# Patient Record
Sex: Male | Born: 1990 | Race: Black or African American | Hispanic: No | Marital: Single | Smoking: Current some day smoker
Health system: Southern US, Community
[De-identification: ages and names within clinical notes are randomized; demographics above are authoritative.]

---
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
## 2011-05-13 LAB — URINALYSIS, ROUTINE W REFLEX MICROSCOPIC
Glucose, UA: NEGATIVE
Hgb urine dipstick: NEGATIVE
Urobilinogen, UA: 0.2
pH: 5.5

## 2011-05-13 LAB — URINE MICROSCOPIC-ADD ON

## 2019-12-14 ENCOUNTER — Ambulatory Visit: Payer: Self-pay | Attending: Internal Medicine

## 2019-12-14 DIAGNOSIS — Z23 Encounter for immunization: Secondary | ICD-10-CM

## 2019-12-14 NOTE — Progress Notes (Signed)
   Covid-19 Vaccination Clinic  Name:  Ian Key    MRN: [id number] DOB: 07/20/1991  12/14/2019  [name] was observed post Covid-19 immunization for 15 minutes without incident. He was provided with Vaccine Information Sheet and instruction to access the V-Safe system.   [name] was instructed to call 911 with any severe reactions post vaccine: Marland Kitchen Difficulty breathing  . Swelling of face and throat  . A fast heartbeat  . A bad rash all over body  . Dizziness and weakness   Immunizations Administered    Name Date Dose VIS Date Route   Pfizer COVID-19 Vaccine 12/14/2019 11:37 AM 0.3 mL 10/06/2018 Intramuscular   Manufacturer: [employer], [employer]   Lot: [id number]   NDC: [address]-2

## 2020-01-11 ENCOUNTER — Ambulatory Visit: Payer: Self-pay

## 2021-06-15 ENCOUNTER — Other Ambulatory Visit: Payer: Self-pay

## 2021-06-15 ENCOUNTER — Emergency Department (HOSPITAL_COMMUNITY)
Admission: EM | Admit: 2021-06-15 | Discharge: 2021-06-19 | Disposition: A | Discharge status: Home / Self Care | Attending: Emergency Medicine | Admitting: Emergency Medicine

## 2021-06-15 DIAGNOSIS — Z20822 Contact with and (suspected) exposure to covid-19: Secondary | ICD-10-CM | POA: Insufficient documentation

## 2021-06-15 DIAGNOSIS — F250 Schizoaffective disorder, bipolar type: Secondary | ICD-10-CM | POA: Diagnosis not present

## 2021-06-15 DIAGNOSIS — R440 Auditory hallucinations: Secondary | ICD-10-CM | POA: Diagnosis present

## 2021-06-15 DIAGNOSIS — R443 Hallucinations, unspecified: Secondary | ICD-10-CM

## 2021-06-15 DIAGNOSIS — F1290 Cannabis use, unspecified, uncomplicated: Secondary | ICD-10-CM | POA: Diagnosis present

## 2021-06-15 LAB — COMPREHENSIVE METABOLIC PANEL
ALT: 27 U/L (ref 0–44)
AST: 18 U/L (ref 15–41)
Albumin: 4.3 g/dL (ref 3.5–5.0)
Alkaline Phosphatase: 44 U/L (ref 38–126)
Anion gap: 6 (ref 5–15)
BUN: 12 mg/dL (ref 6–20)
CO2: 23 mmol/L (ref 22–32)
Calcium: 8.9 mg/dL (ref 8.9–10.3)
Chloride: 108 mmol/L (ref 98–111)
Creatinine, Ser: 1.02 mg/dL (ref 0.61–1.24)
GFR, Estimated: >60 mL/min (ref >60)
Glucose, Bld: 103 mg/dL — ABNORMAL HIGH (ref 70–99)
Potassium: 3.7 mmol/L (ref 3.5–5.1)
Sodium: 137 mmol/L (ref 135–145)
Total Bilirubin: 0.7 mg/dL (ref 0.3–1.2)
Total Protein: 6.9 g/dL (ref 6.5–8.1)

## 2021-06-15 LAB — ETHANOL: Alcohol, Ethyl (B): <10 mg/dL (ref <10)

## 2021-06-15 LAB — RAPID URINE DRUG SCREEN, HOSP PERFORMED
Amphetamines: NOT DETECTED
Barbiturates: NOT DETECTED
Benzodiazepines: NOT DETECTED
Cocaine: NOT DETECTED
Opiates: NOT DETECTED
Tetrahydrocannabinol: POSITIVE — AB

## 2021-06-15 LAB — CBC
HCT: 41.1 % (ref 39.0–52.0)
Hemoglobin: 14.3 g/dL (ref 13.0–17.0)
MCH: 28.9 pg (ref 26.0–34.0)
MCHC: 34.8 g/dL (ref 30.0–36.0)
MCV: 83 fL (ref 80.0–100.0)
Platelets: 215 10*3/uL (ref 150–400)
RBC: 4.95 MIL/uL (ref 4.22–5.81)
RDW: 12.8 % (ref 11.5–15.5)
WBC: 4.5 10*3/uL (ref 4.0–10.5)
nRBC: 0 % (ref 0.0–0.2)

## 2021-06-15 LAB — SALICYLATE LEVEL: Salicylate Lvl: <7 mg/dL — ABNORMAL LOW (ref 7.0–30.0)

## 2021-06-15 LAB — ACETAMINOPHEN LEVEL: Acetaminophen (Tylenol), Serum: <10 ug/mL — ABNORMAL LOW (ref 10–30)

## 2021-06-15 MED ORDER — RISPERIDONE 2 MG PO TABS
2.0000 mg | ORAL_TABLET | Freq: Two times a day (BID) | ORAL | Status: DC
  Administered 2021-06-15 – 2021-06-19 (×6): 2 mg via ORAL
  Filled 2021-06-15 (×6): qty 1
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
## 2021-06-15 NOTE — ED Notes (Signed)
Please call pt's sister, Jimmey Hengel before decisions for treatment [phone]

## 2021-06-15 NOTE — ED Notes (Signed)
Pt provided w/ purple scrubs, pt belonging bags and non-slip socks. Process explained to mother and pt.

## 2021-06-15 NOTE — ED Notes (Signed)
Pt dressed into purple scrubs and wanded by security. Belongings taken and placed behind triage nurse's station.
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
## 2021-06-16 DIAGNOSIS — F1290 Cannabis use, unspecified, uncomplicated: Secondary | ICD-10-CM | POA: Diagnosis present

## 2021-06-16 DIAGNOSIS — F250 Schizoaffective disorder, bipolar type: Secondary | ICD-10-CM
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
## 2021-06-16 NOTE — Progress Notes (Signed)
Patient meets criteria for inpatient treatment per Robb Matar NP. No available beds at [facility] currently. CSW faxed referrals to the following facilities for review (all VA facilities and residential facilities within 75 miles due to [employer] refusal/inability to transport on the weekend):  [facility]  TTS will continue to seek bed placement.   Trula Slade, MSW, LCSW Clinical Social Worker 06/16/2021 1:13 PM

## 2021-06-16 NOTE — ED Notes (Signed)
Pt alert x 4 denies SI/ HI states his mom always call the police on him when she gets mad at times. 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 I will continue to monitor.

## 2021-06-16 NOTE — ED Provider Notes (Signed)
Emergency Medicine Observation Re-evaluation Note  Ian Key is a 30 y.o. male, seen on rounds today.  Pt initially presented to the ED for complaints of IVC Currently, the patient is waiting for an inpatient psychiatric bed.  Physical Exam  BP 109/71 (BP Location: Right Arm)   Pulse 74   Temp (!) 97.5 F (36.4 C) (Oral)   Resp 17   SpO2 97%  Physical Exam General: Calm, resting Cardiac: Regular rate Lungs: Breathing easily Psych: Deferred, sleeping  ED Course / MDM  EKG:   I have reviewed the labs performed to date as well as medications administered while in observation.  Recent changes in the last 24 hours include initial ED evaluation and psychiatric assessment.  Plan  Current plan is for inpatient psychiatric treatment.  Luismanuel L Chavira is not under involuntary commitment.     Linwood Dibbles, MD 06/16/21 [phone]

## 2021-06-17 NOTE — ED Provider Notes (Signed)
Emergency Medicine Observation Re-evaluation Note  Ian Key is a 30 y.o. male, seen on rounds today.  Pt initially presented to the ED for complaints of IVC Currently, the patient is resting comfortably.  Physical Exam  BP 103/79 (BP Location: Right Arm)   Pulse 89   Temp 98 F (36.7 C) (Oral)   Resp 18   SpO2 98%  Physical Exam   ED Course / MDM  EKG:   I have reviewed the labs performed to date as well as medications administered while in observation.  Recent changes in the last 24 hours include nothing.  Plan  Current plan is for awaiting placement.  Belinda L Benally is not under involuntary commitment.     Lorre Nick, MD 06/17/21 1235

## 2021-06-17 NOTE — Progress Notes (Signed)
Pt still meets criteria for inpatient behavioral health placement per Leevy-Johnson, NP. CSW will share in shift report to have 1st shift CSW to follow-up.   Maryjean Ka, MSW, [facility] 06/17/2021 6:34 PM

## 2021-06-18 MED ORDER — ACETAMINOPHEN 325 MG PO TABS
650.0000 mg | ORAL_TABLET | Freq: Four times a day (QID) | ORAL | Status: DC | PRN
  Administered 2021-06-18: 650 mg via ORAL
  Filled 2021-06-18: qty 2

## 2021-06-18 NOTE — Progress Notes (Signed)
06/18/2021  1857  Ian Key [phone] Left message that patient needs to be transported to [facility] 06/19/21 after 8am.

## 2021-06-18 NOTE — Progress Notes (Signed)
Pt was accepted to [facility] tomorrow 06/19/21 after 0800am. -Pt must be IVC'd to be fully accepted.  Pt meets inpatient criteria per Robb Matar NP  Attending Physician will be Dr. Estill Cotta  Report can be called to: [phone] and supervisor phone [phone]  Pt can arrive after 0800am  Care Team notified via secure chat: Kristine Royal, MD, and Vivi Ferns, RN.    Dr. Rodena Medin confirmed that IVC paperwork was completed. CSW requested that transported to law enforcement be called to coordinate transportation. Vivi Ferns, RN confirmed that all informed was received for nursing and shared with CSW that she called and left a message on the sheriff's line for transport tomorrow. CSW requested that IVC paperwork be faxed to [facility] by nursing to [phone].  Kelton Pillar, LCSWA 06/18/2021 @ 6:56 PM

## 2021-06-18 NOTE — Progress Notes (Addendum)
ADDENDUM  CSW has confirmed with April at [facility] that all [facility] facilities are on mental health diversion at this time. CSW will pursue placement at civilian facilities. Patient referred out again at this time.   Receives care at [facility], Social Worker Gayla Medicus [place], Pager [phone] office number [phone] ext [phone]  Call [facility] to [phone] ext [phone] for discharge appointments.   Signed:  Corky Crafts, MSW, [place], LCASA 06/18/2021 1:26 PM  CSW attempted to reach VA to determine psychiatric inpatient availability x2, left HIPAA compliant VM w/ contact information and callback request.   CSW to coordinate with Vikki Ports, [phone], VA liaison with [facility], [facility], and [facility] should the [place] be on diversion.   Situation ongoing, CSW will continue to monitor and update note as more information becomes available.   Signed:  Corky Crafts, MSW, [place], LCASA 06/18/2021 12:53 PM

## 2021-06-18 NOTE — ED Provider Notes (Signed)
Emergency Medicine Observation Re-evaluation Note  Ian Key is a 30 y.o. male, seen on rounds today.  Pt initially presented to the ED for complaints of IVC Currently, the patient is resting.  Physical Exam  BP 111/68 (BP Location: Right Arm)   Pulse 76   Temp 97.6 F (36.4 C) (Oral)   Resp 16   SpO2 99%  Physical Exam General: NAD Cardiac: well perfused Lungs: even and unlabored Psych: No agitation  ED Course / MDM  EKG:   I have reviewed the labs performed to date as well as medications administered while in observation.  Recent changes in the last 24 hours include none.  Plan  Current plan is for inpatient psychiatric admission. Social work Warden/ranger. [name] is not under involuntary commitment.     Ernie Avena, MD 06/18/21 [phone]

## 2021-06-18 NOTE — ED Notes (Signed)
IVC paperwork faxed to [phone] [facility] Intake)

## 2021-06-19 LAB — POC SARS CORONAVIRUS 2 AG -  ED: SARSCOV2ONAVIRUS 2 AG: NEGATIVE

## 2021-06-19 NOTE — ED Notes (Signed)
Pt off unit to [facility] per provider. Pt alert, calm, cooperative, no s/s of distress. DC information and belongings given to sheriff for transport.  Pt ambulatory off unit, escorted and transported by sheriff.

## 2021-06-27 NOTE — Progress Notes (Signed)
Received a call from [facility] regarding patient having (2) IVC dated for 06/15/2021 and 06/18/2021. 06/15/2021  had incorrect information to include date of birth as 11/30/1989, which was initiated by his mother. 06/18/2021 was initiated by EDP. She believes the transporting officer completed the incorrect custody and findings form. Writer provided assistance to the best of my clinical ability to assist without physical documents being present. She states she has no other additional concerns.

## 2021-11-13 ENCOUNTER — Emergency Department (HOSPITAL_COMMUNITY)
Admission: EM | Admit: 2021-11-13 | Discharge: 2021-11-13 | Disposition: A | Discharge status: Other Acute Inpatient Hospital | Attending: Emergency Medicine | Admitting: Emergency Medicine

## 2021-11-13 ENCOUNTER — Other Ambulatory Visit: Payer: Self-pay

## 2021-11-13 ENCOUNTER — Encounter (HOSPITAL_COMMUNITY): Payer: Self-pay

## 2021-11-13 DIAGNOSIS — F312 Bipolar disorder, current episode manic severe with psychotic features: Secondary | ICD-10-CM | POA: Insufficient documentation

## 2021-11-13 DIAGNOSIS — Z20822 Contact with and (suspected) exposure to covid-19: Secondary | ICD-10-CM | POA: Insufficient documentation

## 2021-11-13 DIAGNOSIS — F259 Schizoaffective disorder, unspecified: Secondary | ICD-10-CM | POA: Insufficient documentation

## 2021-11-13 DIAGNOSIS — Z046 Encounter for general psychiatric examination, requested by authority: Secondary | ICD-10-CM | POA: Diagnosis not present

## 2021-11-13 DIAGNOSIS — F1210 Cannabis abuse, uncomplicated: Secondary | ICD-10-CM | POA: Insufficient documentation

## 2021-11-13 LAB — COMPREHENSIVE METABOLIC PANEL
ALT: 22 U/L (ref 0–44)
AST: 16 U/L (ref 15–41)
Albumin: 4.4 g/dL (ref 3.5–5.0)
Alkaline Phosphatase: 37 U/L — ABNORMAL LOW (ref 38–126)
Anion gap: 7 (ref 5–15)
BUN: 9 mg/dL (ref 6–20)
CO2: 25 mmol/L (ref 22–32)
Calcium: 9.3 mg/dL (ref 8.9–10.3)
Chloride: 108 mmol/L (ref 98–111)
Creatinine, Ser: 0.98 mg/dL (ref 0.61–1.24)
GFR, Estimated: >60 mL/min (ref >60)
Glucose, Bld: 105 mg/dL — ABNORMAL HIGH (ref 70–99)
Potassium: 3.9 mmol/L (ref 3.5–5.1)
Sodium: 140 mmol/L (ref 135–145)
Total Bilirubin: 0.9 mg/dL (ref 0.3–1.2)
Total Protein: 7.1 g/dL (ref 6.5–8.1)

## 2021-11-13 LAB — CBC WITH DIFFERENTIAL/PLATELET
Abs Immature Granulocytes: 0.01 10*3/uL (ref 0.00–0.07)
Basophils Absolute: 0 10*3/uL (ref 0.0–0.1)
Basophils Relative: 1 %
Eosinophils Absolute: 0.1 10*3/uL (ref 0.0–0.5)
Eosinophils Relative: 1 %
HCT: 44 % (ref 39.0–52.0)
Hemoglobin: 14.9 g/dL (ref 13.0–17.0)
Immature Granulocytes: 0 %
Lymphocytes Relative: 32 %
Lymphs Abs: 1.9 10*3/uL (ref 0.7–4.0)
MCH: 29.1 pg (ref 26.0–34.0)
MCHC: 33.9 g/dL (ref 30.0–36.0)
MCV: 85.9 fL (ref 80.0–100.0)
Monocytes Absolute: 0.3 10*3/uL (ref 0.1–1.0)
Monocytes Relative: 5 %
Neutro Abs: 3.6 10*3/uL (ref 1.7–7.7)
Neutrophils Relative %: 61 %
Platelets: 239 10*3/uL (ref 150–400)
RBC: 5.12 MIL/uL (ref 4.22–5.81)
RDW: 12.4 % (ref 11.5–15.5)
WBC: 5.9 10*3/uL (ref 4.0–10.5)
nRBC: 0 % (ref 0.0–0.2)

## 2021-11-13 LAB — RAPID URINE DRUG SCREEN, HOSP PERFORMED
Amphetamines: NOT DETECTED
Barbiturates: NOT DETECTED
Benzodiazepines: NOT DETECTED
Cocaine: NOT DETECTED
Opiates: NOT DETECTED
Tetrahydrocannabinol: POSITIVE — AB

## 2021-11-13 LAB — RESP PANEL BY RT-PCR (FLU A&B, COVID) ARPGX2
Influenza A by PCR: NEGATIVE
Influenza B by PCR: NEGATIVE
SARS Coronavirus 2 by RT PCR: NEGATIVE

## 2021-11-13 LAB — SALICYLATE LEVEL: Salicylate Lvl: <7 mg/dL — ABNORMAL LOW (ref 7.0–30.0)

## 2021-11-13 LAB — ACETAMINOPHEN LEVEL: Acetaminophen (Tylenol), Serum: <10 ug/mL — ABNORMAL LOW (ref 10–30)

## 2021-11-13 LAB — ETHANOL: Alcohol, Ethyl (B): <10 mg/dL (ref <10)

## 2021-11-13 LAB — VALPROIC ACID LEVEL: Valproic Acid Lvl: <10 ug/mL — ABNORMAL LOW (ref 50.0–100.0)

## 2021-11-13 MED ORDER — RISPERIDONE 3 MG PO TABS
3.0000 mg | ORAL_TABLET | Freq: Two times a day (BID) | ORAL | Status: DC
  Administered 2021-11-13 (×2): 3 mg via ORAL
  Filled 2021-11-13 (×2): qty 1

## 2021-11-13 MED ORDER — DIVALPROEX SODIUM 500 MG PO DR TAB
500.0000 mg | DELAYED_RELEASE_TABLET | Freq: Two times a day (BID) | ORAL | Status: DC
  Administered 2021-11-13 (×2): 500 mg via ORAL
  Filled 2021-11-13 (×2): qty 1

## 2021-11-13 NOTE — ED Triage Notes (Addendum)
Pt BIB [facility] due to IVC by mother. She stated he was c/o being dead several times. He attempted to buy a gun today & was dx with bipolar. ?

## 2021-11-13 NOTE — ED Provider Notes (Signed)
?[facility] EMERGENCY DEPARTMENT ?Provider Note ? ? ?CSN: [id number] ?Arrival date & time: 11/13/21  0004 ? ?  ? ?History ? ?Chief Complaint  ?Patient presents with  ? IVC  ? ? ?Ian Key is a 31 y.o. male. ? ?The history is provided by the patient and medical records.  ? ?31 year old male with history of schizoaffective disorder, presenting to the ED under IVC petition by his mother.  Reportedly he has made several statements today about "wanting to be dead" and apparently tried to buy handgun today as well.  He has been abusing marijuana. ? ?When asked why patient is here today he states "I do not know".  Denies SI/HI/AVH.  Denies drug or EtOH use today. ? ?Home Medications ?Prior to Admission medications   ?Medication Sig Start Date End Date Taking? Authorizing Provider  ?risperidone (RISPERDAL) 4 MG tablet Take 2 mg by mouth in the morning and at bedtime. 12/13/20   [provider]  ?   ? ?Allergies    ?Patient has no known allergies.   ? ?Review of Systems   ?Review of Systems  ?Psychiatric/Behavioral:    ?     IVC  ?All other systems reviewed and are negative. ? ?Physical Exam ?Updated Vital Signs ?BP (!) 141/90 (BP Location: Right Arm)   Pulse 79   Temp 97.7 ?F (36.5 ?C) (Oral)   SpO2 99%  ? ?Physical Exam ?Vitals and nursing note reviewed.  ?Constitutional:   ?   Appearance: He is well-developed.  ?HENT:  ?   Head: Normocephalic and atraumatic.  ?Eyes:  ?   Conjunctiva/sclera: Conjunctivae normal.  ?   Pupils: Pupils are equal, round, and reactive to light.  ?Cardiovascular:  ?   Rate and Rhythm: Normal rate and regular rhythm.  ?   Heart sounds: Normal heart sounds.  ?Pulmonary:  ?   Effort: Pulmonary effort is normal.  ?   Breath sounds: Normal breath sounds.  ?Abdominal:  ?   General: Bowel sounds are normal.  ?   Palpations: Abdomen is soft.  ?Musculoskeletal:     ?   General: Normal range of motion.  ?   Cervical back: Normal range of motion.  ?Skin: ?   General: Skin is  warm and dry.  ?Neurological:  ?   Mental Status: He is alert and oriented to person, place, and time.  ?Psychiatric:  ?   Comments: Avoidant when questioned. ?Denies SI/HI/AVH  ? ? ?ED Results / Procedures / Treatments   ?Labs ?(all labs ordered are listed, but only abnormal results are displayed) ?Labs Reviewed  ?COMPREHENSIVE METABOLIC PANEL - Abnormal; Notable for the following components:  ?    Result Value  ? Glucose, Bld 105 (*)   ? Alkaline Phosphatase 37 (*)   ? All other components within normal limits  ?RAPID URINE DRUG SCREEN, HOSP PERFORMED - Abnormal; Notable for the following components:  ? Tetrahydrocannabinol POSITIVE (*)   ? All other components within normal limits  ?SALICYLATE LEVEL - Abnormal; Notable for the following components:  ? Salicylate Lvl <7.0 (*)   ? All other components within normal limits  ?ACETAMINOPHEN LEVEL - Abnormal; Notable for the following components:  ? Acetaminophen (Tylenol), Serum <10 (*)   ? All other components within normal limits  ?RESP PANEL BY RT-PCR (FLU A&B, COVID) ARPGX2  ?CBC WITH DIFFERENTIAL/PLATELET  ?ETHANOL  ? ? ?EKG ?None ? ?Radiology ?No results found. ? ?Procedures ?Procedures  ? ? ?Medications Ordered in ED ?  Medications - No data to display ? ?ED Course/ Medical Decision Making/ A&P ?  ?                        ?Medical Decision Making ?Amount and/or Complexity of Data Reviewed ?Labs: ordered. ? ? ?31 year old male presenting to the ED under IVC petition by his mother.  Reportedly has been talking about "wanting to die" and attempted to buy handgun today.  Has history of schizoaffective disorder and is not currently taking his meds.  He is avoidant with questioning during triage.  He denies  SI/HI/AVH.   ? ?Labs were obtained and are overall reassuring.  UDS is positive for THC.  Medically clear.  Will get TTS evaluation. ? ?TTS has evaluated, recommended inpatient treatment. They will seek placement at the [facility] as he has military benefits.   ? ?Final  Clinical Impression(s) / ED Diagnoses ?Final diagnoses:  ?Schizoaffective disorder, unspecified type (HCC)  ? ? ?Rx / DC Orders ?ED Discharge Orders   ? ? None  ? ?  ? ? ?  ?Garlon Hatchet, PA-C ?11/13/21 0545 ? ?  ?Shon Baton, MD ?11/13/21 [phone] ? ?

## 2021-11-13 NOTE — Progress Notes (Signed)
[facility] LCSW Progress Note ?  ?11/13/2021    2:20 PM ? ?NGAI PARCELL  ? ?[id number]  ? ?Type of Contact and Topic:  Psychiatric Bed Placement  ? ?Pt accepted to [facility]   ? ?Patient meets inpatient criteria per Melbourne Abts, PA-C   ? ?The attending provider will be Phineas Inches, MD ? ?Call report to [phone]   ? ?Shuronia Pflueger, RN @ [facility] ED notified.    ? ?Pt scheduled  to arrive at [facility] TODAY at 2200. Please fax IVC paperwork prior to transporting this Pt.  ? ? ?Damita Dunnings, MSW, LCSW-A  ?2:25 PM 11/13/2021   ?  ? ?  ?  ? ? ? ? ?  ?
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
## 2021-11-13 NOTE — ED Notes (Signed)
Patient notified that he will be going to [facility] this evening after 2200. ?

## 2021-11-13 NOTE — ED Notes (Addendum)
Pt is physically in [place] in triage not in waiting room.Will call security to want patient ?
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
## 2021-11-14 ENCOUNTER — Other Ambulatory Visit: Payer: Self-pay

## 2021-11-14 ENCOUNTER — Inpatient Hospital Stay (HOSPITAL_COMMUNITY)
Admission: AD | Admit: 2021-11-14 | Discharge: 2021-11-21 | DRG: 885 | Disposition: A | Discharge status: Home / Self Care | Source: Intra-hospital | Attending: Psychiatry | Admitting: Psychiatry

## 2021-11-14 ENCOUNTER — Encounter (HOSPITAL_COMMUNITY): Payer: Self-pay

## 2021-11-14 ENCOUNTER — Encounter (HOSPITAL_COMMUNITY): Payer: Self-pay | Admitting: Student

## 2021-11-14 DIAGNOSIS — F17200 Nicotine dependence, unspecified, uncomplicated: Secondary | ICD-10-CM | POA: Diagnosis present

## 2021-11-14 DIAGNOSIS — Z599 Problem related to housing and economic circumstances, unspecified: Secondary | ICD-10-CM

## 2021-11-14 DIAGNOSIS — F1290 Cannabis use, unspecified, uncomplicated: Secondary | ICD-10-CM | POA: Diagnosis present

## 2021-11-14 DIAGNOSIS — H16149 Punctate keratitis, unspecified eye: Secondary | ICD-10-CM | POA: Insufficient documentation

## 2021-11-14 DIAGNOSIS — F17210 Nicotine dependence, cigarettes, uncomplicated: Secondary | ICD-10-CM | POA: Diagnosis present

## 2021-11-14 DIAGNOSIS — G4700 Insomnia, unspecified: Secondary | ICD-10-CM | POA: Diagnosis present

## 2021-11-14 DIAGNOSIS — F1020 Alcohol dependence, uncomplicated: Secondary | ICD-10-CM | POA: Insufficient documentation

## 2021-11-14 DIAGNOSIS — F1210 Cannabis abuse, uncomplicated: Secondary | ICD-10-CM | POA: Diagnosis not present

## 2021-11-14 DIAGNOSIS — Z91148 Patient's other noncompliance with medication regimen for other reason: Secondary | ICD-10-CM | POA: Diagnosis not present

## 2021-11-14 DIAGNOSIS — F419 Anxiety disorder, unspecified: Secondary | ICD-10-CM | POA: Diagnosis present

## 2021-11-14 DIAGNOSIS — M25529 Pain in unspecified elbow: Secondary | ICD-10-CM | POA: Insufficient documentation

## 2021-11-14 DIAGNOSIS — F250 Schizoaffective disorder, bipolar type: Principal | ICD-10-CM | POA: Diagnosis present

## 2021-11-14 DIAGNOSIS — E559 Vitamin D deficiency, unspecified: Secondary | ICD-10-CM | POA: Diagnosis present

## 2021-11-14 DIAGNOSIS — F1910 Other psychoactive substance abuse, uncomplicated: Secondary | ICD-10-CM | POA: Insufficient documentation

## 2021-11-14 LAB — LIPID PANEL
Cholesterol: 148 mg/dL (ref 0–200)
HDL: 47 mg/dL (ref >40)
LDL Cholesterol: 92 mg/dL (ref 0–99)
Total CHOL/HDL Ratio: 3.1 RATIO
Triglycerides: 47 mg/dL (ref <150)
VLDL: 9 mg/dL (ref 0–40)

## 2021-11-14 LAB — TSH: TSH: 1.046 u[IU]/mL (ref 0.350–4.500)

## 2021-11-14 MED ORDER — LORAZEPAM 1 MG PO TABS: 1.0000 mg | ORAL_TABLET | ORAL | Status: DC | PRN

## 2021-11-14 MED ORDER — ACETAMINOPHEN 325 MG PO TABS: 650.0000 mg | ORAL_TABLET | Freq: Four times a day (QID) | ORAL | Status: DC | PRN

## 2021-11-14 MED ORDER — RISPERIDONE 3 MG PO TABS
3.0000 mg | ORAL_TABLET | Freq: Two times a day (BID) | ORAL | Status: DC
  Administered 2021-11-14 – 2021-11-15 (×3): 3 mg via ORAL
  Filled 2021-11-14 (×5): qty 1

## 2021-11-14 MED ORDER — MAGNESIUM HYDROXIDE 400 MG/5ML PO SUSP: 30.0000 mL | Freq: Every day | ORAL | Status: DC | PRN

## 2021-11-14 MED ORDER — DIVALPROEX SODIUM 500 MG PO DR TAB
500.0000 mg | DELAYED_RELEASE_TABLET | Freq: Two times a day (BID) | ORAL | Status: DC
  Administered 2021-11-14 – 2021-11-15 (×3): 500 mg via ORAL
  Filled 2021-11-14 (×5): qty 1

## 2021-11-14 MED ORDER — TRAZODONE HCL 50 MG PO TABS
50.0000 mg | ORAL_TABLET | Freq: Every evening | ORAL | Status: DC | PRN
  Filled 2021-11-14: qty 1

## 2021-11-14 MED ORDER — ALUM & MAG HYDROXIDE-SIMETH 200-200-20 MG/5ML PO SUSP
30.0000 mL | ORAL | Status: DC | PRN
  Administered 2021-11-20: 30 mL via ORAL
  Filled 2021-11-14: qty 30

## 2021-11-14 MED ORDER — HYDROXYZINE HCL 25 MG PO TABS
25.0000 mg | ORAL_TABLET | Freq: Three times a day (TID) | ORAL | Status: DC | PRN
  Filled 2021-11-14: qty 1

## 2021-11-14 MED ORDER — THIAMINE HCL 100 MG PO TABS
100.0000 mg | ORAL_TABLET | Freq: Every day | ORAL | Status: DC
  Administered 2021-11-14 – 2021-11-15 (×2): 100 mg via ORAL
  Filled 2021-11-14 (×3): qty 1
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
## 2021-11-14 NOTE — Plan of Care (Signed)
?  Problem: Activity: ?Goal: Interest or engagement in activities will improve ?Outcome: Progressing ?  ?Problem: Coping: ?Goal: Ability to verbalize frustrations and anger appropriately will improve ?Outcome: Progressing ?  ?Problem: Coping: ?Goal: Ability to demonstrate self-control will improve ?Outcome: Progressing ?  ?Problem: Safety: ?Goal: Periods of time without injury will increase ?Outcome: Progressing ?  ?Problem: Nutritional: ?Goal: Ability to achieve adequate nutritional intake will improve ?Outcome: Progressing ?  ?
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
## 2021-11-14 NOTE — Progress Notes (Signed)
?   11/14/21 1322  ?Psych Admission Type (Psych Patients Only)  ?Admission Status Involuntary  ?Psychosocial Assessment  ?Patient Complaints Worrying  ?Eye Contact Fair  ?Facial Expression Flat  ?Affect Flat  ?Speech Logical/coherent  ?Interaction Minimal  ?Motor Activity Other (Comment) ?(WNL)  ?Appearance/Hygiene In scrubs  ?Behavior Characteristics Cooperative;Calm  ?Mood Pleasant  ?Thought Process  ?Coherency WDL  ?Content WDL  ?Delusions None reported or observed  ?Perception WDL  ?Hallucination None reported or observed  ?Judgment Poor  ?Confusion None  ?Danger to Self  ?Current suicidal ideation? Denies  ?Danger to Others  ?Danger to Others None reported or observed  ? ? ?

## 2021-11-14 NOTE — Group Note (Signed)
LCSW Group Therapy Note ?  ?  ?Group Date: 11/14/2021 ?Start Time: 1300 ?End Time: 1400 ?  ?Type of Therapy and Topic:  Group Therapy: Self-Esteem  ?  ?Participation Level:  Did not attend ?  ?Description of Group:   Due to infectious disease and illness on the unit, staff recommended no close contact at this time so group was not held.  Patient was provided with written information and worksheets about Self-Esteem.  Pt was sleeping and would not wake to discuss packet. ? ? ? ?Izzabell Klasen MSW, LCSW ?Clincal Social Worker  ?[facility]  ?
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
## 2021-11-14 NOTE — BH IP Treatment Plan (Signed)
Interdisciplinary Treatment and Diagnostic Plan Update ? ?11/14/2021 ?Time of Session: 9:45am  ?Ian Key ?MRN: [id number] ? ?Principal Diagnosis: Schizoaffective disorder, bipolar type ([place]) ? ?Secondary Diagnoses: Principal Problem: ?  Schizoaffective disorder, bipolar type ([place]) ?Active Problems: ?  Marijuana use ?  Housing problems ?  Insomnia ?  Anxiety ?  Tobacco use disorder ? ? ?Current Medications:  ?Current Facility-Administered Medications  ?Medication Dose Route Frequency Provider Last Rate Last Admin  ? acetaminophen (TYLENOL) tablet 650 mg  650 mg Oral Q6H PRN Prescilla Sours, PA-C      ? alum & mag hydroxide-simeth (MAALOX/MYLANTA) 200-200-20 MG/5ML suspension 30 mL  30 mL Oral Q4H PRN Prescilla Sours, PA-C      ? divalproex (DEPAKOTE) DR tablet 500 mg  500 mg Oral Q12H Margorie John W, PA-C   500 mg at 11/14/21 7893  ? hydrOXYzine (ATARAX) tablet 25 mg  25 mg Oral TID PRN Prescilla Sours, PA-C      ? LORazepam (ATIVAN) tablet 1 mg  1 mg Oral Q4H PRN Hill, Jackie Plum, MD      ? magnesium hydroxide (MILK OF MAGNESIA) suspension 30 mL  30 mL Oral Daily PRN Prescilla Sours, PA-C      ? risperiDONE (RISPERDAL) tablet 3 mg  3 mg Oral BID Prescilla Sours, PA-C   3 mg at 11/14/21 8101  ? thiamine tablet 100 mg  100 mg Oral Daily Hill, Jackie Plum, MD   100 mg at 11/14/21 1305  ? traZODone (DESYREL) tablet 50 mg  50 mg Oral QHS PRN Prescilla Sours, PA-C      ? ?PTA Medications: ?Medications Prior to Admission  ?Medication Sig Dispense Refill Last Dose  ? divalproex (DEPAKOTE) 250 MG DR tablet Take 250 mg by mouth See admin instructions. 250 mg in the morning  ?500 mg at bedtime (Patient not taking: Reported on 11/13/2021)     ? risperiDONE (RISPERDAL) 3 MG tablet Take 3 mg by mouth 2 (two) times daily. (Patient not taking: Reported on 11/13/2021)     ? ? ?Patient Stressors: Marital or family conflict   ?Medication change or noncompliance   ? ?Patient Strengths: General fund of knowledge  ?Motivation for  treatment/growth  ?Supportive family/friends  ? ?Treatment Modalities: Medication Management, Group therapy, Case management,  ?1 to 1 session with clinician, Psychoeducation, Recreational therapy. ? ? ?Physician Treatment Plan for Primary Diagnosis: Schizoaffective disorder, bipolar type ([place]) ?Long Term Goal(s):    ? ?Short Term Goals:   ? ?Medication Management: Evaluate patient's response, side effects, and tolerance of medication regimen. ? ?Therapeutic Interventions: 1 to 1 sessions, Unit Group sessions and Medication administration. ? ?Evaluation of Outcomes: Not Met ? ?Physician Treatment Plan for Secondary Diagnosis: Principal Problem: ?  Schizoaffective disorder, bipolar type ([place]) ?Active Problems: ?  Marijuana use ?  Housing problems ?  Insomnia ?  Anxiety ?  Tobacco use disorder ? ?Long Term Goal(s):    ? ?Short Term Goals:      ? ?Medication Management: Evaluate patient's response, side effects, and tolerance of medication regimen. ? ?Therapeutic Interventions: 1 to 1 sessions, Unit Group sessions and Medication administration. ? ?Evaluation of Outcomes: Not Met ? ? ?RN Treatment Plan for Primary Diagnosis: Schizoaffective disorder, bipolar type ([place]) ?Long Term Goal(s): Knowledge of disease and therapeutic regimen to maintain health will improve ? ?Short Term Goals: Ability to remain free from injury will improve, Ability to participate in decision making will improve, Ability to verbalize feelings  will improve, Ability to disclose and discuss suicidal ideas, and Ability to identify and develop effective coping behaviors will improve ? ?Medication Management: RN will administer medications as ordered by provider, will assess and evaluate patient's response and provide education to patient for prescribed medication. RN will report any adverse and/or side effects to prescribing provider. ? ?Therapeutic Interventions: 1 on 1 counseling sessions, Psychoeducation, Medication administration, Evaluate  responses to treatment, Monitor vital signs and CBGs as ordered, Perform/monitor CIWA, COWS, AIMS and Fall Risk screenings as ordered, Perform wound care treatments as ordered. ? ?Evaluation of Outcomes: Not Met ? ? ?LCSW Treatment Plan for Primary Diagnosis: Schizoaffective disorder, bipolar type ([place]) ?Long Term Goal(s): Safe transition to appropriate next level of care at discharge, Engage patient in therapeutic group addressing interpersonal concerns. ? ?Short Term Goals: Engage patient in aftercare planning with referrals and resources, Increase social support, Increase emotional regulation, Facilitate acceptance of mental health diagnosis and concerns, Identify triggers associated with mental health/substance abuse issues, and Increase skills for wellness and recovery ? ?Therapeutic Interventions: Assess for all discharge needs, 1 to 1 time with Education officer, museum, Explore available resources and support systems, Assess for adequacy in community support network, Educate family and significant other(s) on suicide prevention, Complete Psychosocial Assessment, Interpersonal group therapy. ? ?Evaluation of Outcomes: Not Met ? ? ?Progress in Treatment: ?Attending groups: No. ?Participating in groups: No. ?Taking medication as prescribed: Yes. ?Toleration medication: Yes. ?Family/Significant other contact made: Yes, individual(s) contacted:  Mother  ?Patient understands diagnosis: No. ?Discussing patient identified problems/goals with staff: Yes. ?Medical problems stabilized or resolved: Yes. ?Denies suicidal/homicidal ideation: Yes. ?Issues/concerns per patient self-inventory: No. ? ? ?New problem(s) identified: No, Describe:  None  ? ?New Short Term/Long Term Goal(s): medication stabilization, elimination of SI thoughts, development of comprehensive mental wellness plan. ? ?Patient Goals: "To get all the hurt feelings out of the way"  ? ?Discharge Plan or Barriers: Patient recently admitted. CSW will continue to follow  and assess for appropriate referrals and possible discharge planning. ? ?Reason for Continuation of Hospitalization: Delusions  ?Hallucinations ?Medication stabilization ? ?Estimated Length of Stay: 3 to 5 days  ? ? ?Scribe for Treatment Team: ?Darleen Crocker, Latanya Presser ?11/14/2021 ?1:31 PM ?

## 2021-11-14 NOTE — Progress Notes (Signed)
?   11/14/21 2000  ?Psych Admission Type (Psych Patients Only)  ?Admission Status Involuntary  ?Psychosocial Assessment  ?Patient Complaints Worrying  ?Eye Contact Fair  ?Facial Expression Sad  ?Affect Apprehensive  ?Speech Soft;Slow  ?Interaction Cautious  ?Motor Activity Slow  ?Appearance/Hygiene In scrubs  ?Behavior Characteristics Cooperative;Calm  ?Mood Pleasant;Anxious  ?Aggressive Behavior  ?Effect No apparent injury  ?Thought Process  ?Coherency WDL  ?Content Blaming others  ?Delusions WDL  ?Perception Hallucinations  ?Hallucination Auditory;Visual  ?Judgment Impaired  ?Confusion WDL  ?Danger to Self  ?Current suicidal ideation? Denies  ?Danger to Others  ?Danger to Others None reported or observed  ? ? ?

## 2021-11-14 NOTE — Progress Notes (Signed)
?   11/14/21 0500  ?Sleep  ?Number of Hours 4  ? ? ?

## 2021-11-14 NOTE — Tx Team (Signed)
Initial Treatment Plan ?11/14/2021 ?12:47 AM ?[name] ?[id number] ? ? ? ?PATIENT STRESSORS: ?Marital or family conflict   ?Medication change or noncompliance   ? ? ?PATIENT STRENGTHS: ?General fund of knowledge  ?Motivation for treatment/growth  ?Supportive family/friends  ? ? ?PATIENT IDENTIFIED PROBLEMS: ? Risk for SI  ?Psychosis  ?"Not really"  ?  ?  ?  ?  ?  ?  ?  ? ?DISCHARGE CRITERIA:  ?Improved stabilization in mood, thinking, and/or behavior ?Verbal commitment to aftercare and medication compliance ? ?PRELIMINARY DISCHARGE PLAN: ?Attend aftercare/continuing care group ?Attend PHP/IOP ?Outpatient therapy ? ?PATIENT/FAMILY INVOLVEMENT: ?This treatment plan has been presented to and reviewed with the patient, Ian Key.  The patient and family have been given the opportunity to ask questions and make suggestions. ? ?Delos Haring, RN ?11/14/2021, 12:47 AM ?

## 2021-11-14 NOTE — Group Note (Signed)
Recreation Therapy Group Note ? ? ?Group Topic:Leisure Education  ?Group Date: 11/14/2021 ?Start Time: 1005 ?End Time: [id number] ?Facilitators: Victorino Sparrow, LRT,CTRS ?Location: [facility] ? ? ?Goal Area(s) Addresses:  ?Patient will successfully identify positive leisure and recreation activities.  ?Patient will acknowledge benefits of participation in healthy leisure activities post discharge.  ?Patient will actively work with peers toward a shared goal. ?  ?Group Description: Pictionary. In groups, patients took turns trying to guess the picture being drawn on the board by their peers.  If someone guessed the correct answer, they got the next turn.  After several rounds of game play, the group would discuss the importance of leisure in everyday life. Post-activity discussion reviewed benefits of positive recreation outlets: reducing stress, improving coping mechanisms, increasing self-esteem, and building larger support systems. ? ? ?Affect/Mood: Appropriate ?  ?Participation Level: Engaged ?  ?Participation Quality: Independent ?  ?Behavior: Appropriate ?  ?Speech/Thought Process: Focused ?  ?Insight: Good ?  ?Judgement: Good ?  ?Modes of Intervention: Competitive Play ?  ?Patient Response to Interventions:  Engaged ?  ?Education Outcome: ? Acknowledges education and In group clarification offered   ? ?Clinical Observations/Individualized Feedback: Pt was quiet but engaged when prompted.  Pt was bright, polite and appeared drowsy at times.  Pt was appropriate throughout group. ? ? ?Plan: Continue to engage patient in RT group sessions 2-3x/week. ? ? ?Victorino Sparrow, LRT,CTRS ?11/14/2021 1:08 PM ?

## 2021-11-14 NOTE — Progress Notes (Signed)
Recreation Therapy Notes ? ?INPATIENT RECREATION THERAPY ASSESSMENT ? ?Patient Details ?Name: Ian Key ?MRN: [id number] ?DOB: [birth year] ?Today's Date: 11/14/2021 ?      ?Information Obtained From: ?Patient ? ?Able to Participate in Assessment/Interview: ?Yes ? 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 ?Reason for Admission (Per Patient): ?Other (Comments) (Pt stated he got an attitude with his mother because she yelled at his grandmother and was in his conversation.) ? ?Patient Stressors: ? (None) ? ?Coping Skills:   ?Journal, TV, Sports, Exercise, Music, Deep Breathing, Meditate, Talk, Art, Prayer, Avoidance, Read, Hot Bath/Shower ? ?Leisure Interests (2+):  ?Social - Friends, Individual - Other (Comment) (Skating) ? ?Frequency of Recreation/Participation: ?Other (Comment) Syliva Overman; Friend- Just meet) ? ?Awareness of Community Resources:  ?Yes ? ?Community Resources:  ?Tree surgeon, [place] ? ?Current Use: ?Yes ? ?If no, Barriers?: ?  ? ?Expressed Interest in [employer] Information: ?No ? ?[place] of Residence:  ?[place] ? ?Patient Main Form of Transportation: ?Other (Comment) (Mother) ? ?Patient Strengths:  ?Leadership; Physical ? ?Patient Identified Areas of Improvement:  ?Need a job; Need own stuff ? ?Patient Goal for Hospitalization:  ?"step back from everything and get my mind right to start doing what I was doing and not have any hard feelings towards anyone". ? ?Current SI (including self-harm):  ?No ? ?Current HI:  ?No ? ?Current AVH: ?No ? ?Staff Intervention Plan: ?Group Attendance, Collaborate with Interdisciplinary Treatment Team ? ?Consent to Intern Participation: ?N/A ? ? ?Caroll Rancher, LRT,CTRS ?Caroll Rancher A ?11/14/2021, 1:21 PM ?

## 2021-11-15 DIAGNOSIS — E559 Vitamin D deficiency, unspecified: Secondary | ICD-10-CM | POA: Diagnosis present

## 2021-11-15 LAB — VITAMIN B12: Vitamin B-12: 276 pg/mL (ref 180–914)

## 2021-11-15 LAB — VITAMIN D 25 HYDROXY (VIT D DEFICIENCY, FRACTURES): Vit D, 25-Hydroxy: 14.77 ng/mL — ABNORMAL LOW (ref 30–100)

## 2021-11-15 LAB — HEMOGLOBIN A1C
Hgb A1c MFr Bld: 5.7 % — ABNORMAL HIGH (ref 4.8–5.6)
Mean Plasma Glucose: 117 mg/dL

## 2021-11-15 LAB — MAGNESIUM: Magnesium: 2.2 mg/dL (ref 1.7–2.4)

## 2021-11-15 LAB — VALPROIC ACID LEVEL: Valproic Acid Lvl: 88 ug/mL (ref 50.0–100.0)

## 2021-11-15 LAB — HEPATITIS PANEL, ACUTE
HCV Ab: NONREACTIVE
Hep A IgM: NONREACTIVE
Hep B C IgM: NONREACTIVE
Hepatitis B Surface Ag: NONREACTIVE

## 2021-11-15 LAB — RPR: RPR Ser Ql: NONREACTIVE

## 2021-11-15 MED ORDER — HALOPERIDOL 5 MG PO TABS
5.0000 mg | ORAL_TABLET | Freq: Two times a day (BID) | ORAL | Status: DC
  Administered 2021-11-15 – 2021-11-16 (×3): 5 mg via ORAL
  Filled 2021-11-15 (×6): qty 1

## 2021-11-15 MED ORDER — MIRTAZAPINE 7.5 MG PO TABS
7.5000 mg | ORAL_TABLET | Freq: Every day | ORAL | Status: DC
Start: 2021-11-15 — End: 2021-11-19
  Administered 2021-11-15 – 2021-11-18 (×4): 7.5 mg via ORAL
  Filled 2021-11-15 (×6): qty 1

## 2021-11-15 MED ORDER — RENA-VITE PO TABS
1.0000 | ORAL_TABLET | Freq: Every day | ORAL | Status: DC
  Administered 2021-11-16: 1 via ORAL
  Filled 2021-11-15 (×3): qty 1

## 2021-11-15 MED ORDER — VITAMIN D (ERGOCALCIFEROL) 1.25 MG (50000 UNIT) PO CAPS
50000.0000 [IU] | ORAL_CAPSULE | ORAL | Status: DC
  Administered 2021-11-15: 50000 [IU] via ORAL
  Filled 2021-11-15: qty 1
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
## 2021-11-15 NOTE — Group Note (Signed)
Recreation Therapy Group Note ? ? ?Group Topic:Team Building  ?Group Date: 11/15/2021 ?Start Time: 27 ?End Time: 1050 ?Facilitators: Caroll Rancher, LRT,CTRS ?Location: [address] Day[facility] ? ? ?Goal Area(s) Addresses:  ?Patient will effectively work with peer towards shared goal.  ?Patient will identify skills used to make activity successful.  ?Patient will identify how skills used during activity can be applied to reach post d/c goals.  ? ?Group Description:Tallest [employer]. In teams of 5-6, patients were given 11 craft pipe cleaners. Using the materials provided, patients were instructed to compete again the opposing team(s) to build the tallest free-standing structure from floor level. The activity was timed; difficulty increased by Clinical research associate as Production designer, theatre/television/film continued.  Systematically resources were removed with additional directions for example, placing one arm behind their back, working in silence, and shape stipulations. LRT facilitated post-activity discussion reviewing team processes and necessary communication skills involved in completion. Patients were encouraged to reflect how the skills utilized, or not utilized, in this activity can be incorporated to positively impact support systems post discharge. ? ? ?Affect/Mood: Appropriate ?  ?Participation Level: Engaged ?  ?Participation Quality: Independent ?  ?Behavior: Appropriate ?  ?Speech/Thought Process: Focused ?  ?Insight: Good ?  ?Judgement: Good ?  ?Modes of Intervention: STEM Activity ?  ?Patient Response to Interventions:  Engaged ?  ?Education Outcome: ? Acknowledges education and In group clarification offered   ? ?Clinical Observations/Individualized Feedback: Pt was bright, social and shared ideas with peers.  Pt was also attentive to the ideas of peers.  Pt worked well with peers in completing the activity.  Pt expressed the hardest part was not being able to communicate and only using one hand.  Pt explained it was easy when  using teamwork.  Pt expressed the support system is good for encouragement with help. ? ? ?Plan: Continue to engage patient in RT group sessions 2-3x/week. ? ? ?Caroll Rancher, LRT,CTRS ?11/15/2021 11:42 AM ?
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
## 2021-11-15 NOTE — Progress Notes (Signed)
?   11/15/21 0530  ?Sleep  ?Number of Hours 9.25  ? ? ?

## 2021-11-15 NOTE — [facility] Group Notes (Signed)
PsychoEducational Group Note- ?Patients were educated on positive reframing and how negative thinking patterns impact our mental health. Patients were read a poem '' The pencil Analogy '' Patients were encouraged to practice this thinking to impact mental health and then asked what negative thought they would like to let go of. Patient shared that he would like to allow his family to be more supportive of him and accepting their support.  ?

## 2021-11-15 NOTE — Group Note (Signed)
Date:  11/15/2021 ?Time:  9:30 AM ? ?Group Topic/Focus:  ?Goals Group:   The focus of this group is to help patients establish daily goals to achieve during treatment and discuss how the patient can incorporate goal setting into their daily lives to aide in recovery. ?Orientation:   The focus of this group is to educate the patient on the purpose and policies of crisis stabilization and provide a format to answer questions about their admission.  The group details unit policies and expectations of patients while admitted. ? ? ? ?Participation Level:  Did Not Attend ? ?Participation Quality:   ? ?Affect:   ? ?Cognitive:   ? ?Insight:  ? ?Engagement in Group:   ? ?Modes of Intervention:   ? ?Additional Comments:   ? ?Jaquita Rector ?11/15/2021, 9:30 AM ? ?

## 2021-11-15 NOTE — Progress Notes (Signed)
?   11/15/21 2015  ?Psych Admission Type (Psych Patients Only)  ?Admission Status Involuntary  ?Psychosocial Assessment  ?Patient Complaints Worrying  ?Eye Contact Fair  ?Facial Expression Sad  ?Affect Apprehensive  ?Speech Soft;Slow  ?Interaction Cautious  ?Motor Activity Slow  ?Appearance/Hygiene In scrubs  ?Behavior Characteristics Cooperative  ?Mood Pleasant;Anxious  ?Aggressive Behavior  ?Effect No apparent injury  ?Thought Process  ?Coherency WDL  ?Content Blaming others  ?Delusions WDL  ?Perception Hallucinations  ?Hallucination Auditory;Visual  ?Judgment Impaired  ?Confusion WDL  ?Danger to Self  ?Current suicidal ideation? Denies  ?Danger to Others  ?Danger to Others None reported or observed  ? ? ?
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
## 2021-11-16 MED ORDER — RENA-VITE PO TABS
1.0000 | ORAL_TABLET | Freq: Every day | ORAL | Status: DC
  Administered 2021-11-17 – 2021-11-21 (×5): 1 via ORAL
  Filled 2021-11-16 (×6): qty 1

## 2021-11-16 MED ORDER — HALOPERIDOL 2 MG PO TABS
2.0000 mg | ORAL_TABLET | Freq: Every day | ORAL | Status: DC
  Administered 2021-11-17 – 2021-11-21 (×5): 2 mg via ORAL
  Filled 2021-11-16 (×7): qty 1

## 2021-11-16 MED ORDER — HALOPERIDOL 5 MG PO TABS
5.0000 mg | ORAL_TABLET | Freq: Every day | ORAL | Status: DC
  Administered 2021-11-16 – 2021-11-17 (×2): 5 mg via ORAL
  Filled 2021-11-16 (×4): qty 1

## 2021-11-16 NOTE — Progress Notes (Signed)
?   11/16/21 0530  ?Sleep  ?Number of Hours 9  ? ? ?

## 2021-11-16 NOTE — Progress Notes (Signed)
Pt denies SI/HI/AVH and verbally agrees to approach staff if these become apparent or before harming themselves/others. Rates depression 0/10. Rates anxiety 0/10. Rates pain 0/10. Pt has been sleeping in his room for the entire day and only came out for medications and v/s. Pt was smiling and animated this am. Pt is forwards little and will only respond with short answers. RN has not noticed any AVH. Scheduled medications administered to pt, per MD orders. RN provided support and encouragement to pt. Q15 min safety checks implemented and continued. Pt safe on the unit. RN will continue to monitor and intervene as needed.  ? 11/16/21 0758  ?Psych Admission Type (Psych Patients Only)  ?Admission Status Involuntary  ?Psychosocial Assessment  ?Patient Complaints None  ?Eye Contact Fair  ?Facial Expression Animated  ?Affect Appropriate to circumstance  ?Speech Logical/coherent  ?Interaction Minimal;Isolative  ?Motor Activity Other (Comment) ?(WDL)  ?Appearance/Hygiene Unremarkable;In scrubs  ?Behavior Characteristics Cooperative;Appropriate to situation;Calm  ?Mood Pleasant  ?Thought Process  ?Coherency WDL  ?Content WDL  ?Delusions None reported or observed  ?Perception WDL  ?Hallucination None reported or observed  ?Judgment Impaired  ?Confusion None  ?Danger to Self  ?Current suicidal ideation? Denies  ?Danger to Others  ?Danger to Others None reported or observed  ? ? ?

## 2021-11-16 NOTE — [facility] Group Notes (Signed)
Spirituality group facilitated by Chaplain Katy Julene Rahn, B[facility].  ? ?Group Description: Group focused on topic of hope. Patients participated in facilitated discussion around topic, connecting with one another around experiences and definitions for hope. Group members engaged with visual explorer photos, reflecting on what hope looks like for them today. Group engaged in discussion around how their definitions of hope are present today in hospital.  ? ?Modalities: Psycho-social ed, Adlerian, Narrative, MI  ? ?Patient Progress: Patient was asleep and did not attend group.  ?

## 2021-11-16 NOTE — Progress Notes (Signed)
Adult Psychoeducational Group Note ? ?Date:  11/16/2021 ?Time:  8:42 PM ? ?Group Topic/Focus:  ?Wrap-Up Group:   The focus of this group is to help patients review their daily goal of treatment and discuss progress on daily workbooks. ? ?Participation Level:  Did Not Attend ? ?Participation Quality:  Did not attend ? ?Affect:  Did not attend ? ?Cognitive:  Did not attend ? ?Insight: None ? ?Engagement in Group:  Did not attend  ? ?Modes of Intervention:  Did not attend  ? ?Additional Comments:   ?Pt was encouraged to attend group but refused  ? ?Ian Key ?11/16/2021, 8:42 PM ?

## 2021-11-16 NOTE — Progress Notes (Signed)
[facility] MD Progress Note ? ?11/16/2021 5:11 PM ?Ian Key  ?MRN:  [id number] ? ?History of Present Illness: Ian Key is a 31 YO M brought in on IVC by mother. [ ]  ?            Per chart/EMR review: the patient had made several statements to mother about wanting to be dead on the Dec 13, 2022. There was some effort made to obtain a firearm but was unsuccessful. He has previously been diagnosed with Schizoaffective/bipolar and [facility] in [place] a few years ago), but has been medication non-compliant recently, and using cannabis at home. He has not been sleeping, aggressive with younger sibling and responding to internal stimuli.  ? ?24 EMR reviewed. Case discussed in progression rounds. Per nursing report, the patient has been having hallucinations. He was anxious yesterday evening.  ?Slept 9 hours overnight. He came to the group today late and was drowsy before lunch and did not go to the one in the afternoon.  ? ?Subjective:  on interview, the patient is again drowsy. He is arousable for short periods, but says little and it is difficult to understand. He doesn't elaborate on open ended questions and doesn't engage in casual conversation. He is eating well. He is sleeping at night. He hasn't had any problems with the other patients. He has been in contact with his mothers and there hasn't been any conflicts there. No physical complaints. No recent events of "calling names". No thoughts of harm to self or others.  ? ?Principal Problem: Schizoaffective disorder, bipolar type (HCC) ?Diagnosis: Principal Problem: ?  Schizoaffective disorder, bipolar type (HCC) ?Active Problems: ?  Marijuana use ?  Housing problems ?  Anxiety ?  Tobacco use disorder ?  Vitamin D deficiency ? ?Total Time spent with patient: 45 minutes ? ?Past Psychiatric History:  previously been diagnosed with Schizoaffective/bipolar and treated at [facility], three hospitalizations in [place] a  few years ago ?Outpatient is at the [place] in [place] ?Has been on depakote and risperdal ? ?Past Medical History: History reviewed. No pertinent past medical history. History reviewed. No pertinent surgical history. ?Family History: History reviewed. No pertinent family history. ?Family Psychiatric  History: n/a ?Social History:  ?Social History  ? ?Substance and Sexual Activity  ?Alcohol Use Yes  ?   ?Social History  ? ?Substance and Sexual Activity  ?Drug Use Yes  ? Types: Marijuana  ?  ?Social History  ? ?Socioeconomic History  ? Marital status: Single  ?  Spouse name: Not on file  ? Number of children: Not on file  ? Years of education: Not on file  ? Highest education level: Not on file  ?Occupational History  ? Not on file  ?Tobacco Use  ? Smoking status: Some Days  ?  Packs/day: 0.25  ?  Years: 12.00  ?  Pack years: 3.00  ?  Types: Cigarettes  ? Smokeless tobacco: Never  ?Vaping Use  ? Vaping Use: Never used  ?Substance and Sexual Activity  ? Alcohol use: Yes  ? Drug use: Yes  ?  Types: Marijuana  ? Sexual activity: Yes  ?  Birth control/protection: Condom  ?Other Topics Concern  ? Not on file  ?Social History Narrative  ? Not on file  ? ?Social Determinants of Health  ? ?Financial Resource Strain: Not on file  ?Food Insecurity: Not on file  ?Transportation Needs: Not on file  ?Physical Activity: Not on file  ?  Stress: Not on file  ?Social Connections: Not on file  ? ?Additional Social History:  ?  ?  ?  ?  ?  ?  ?  ?  ?  ?  ?  ? ?Sleep:  increased ? ?Appetite:  Good ? ?Current Medications: ?Current Facility-Administered Medications  ?Medication Dose Route Frequency Provider Last Rate Last Admin  ? acetaminophen (TYLENOL) tablet 650 mg  650 mg Oral Q6H PRN Jaclyn Shaggy, PA-C      ? alum & mag hydroxide-simeth (MAALOX/MYLANTA) 200-200-20 MG/5ML suspension 30 mL  30 mL Oral Q4H PRN Melbourne Abts W, PA-C      ? haloperidol (HALDOL) tablet 5 mg  5 mg Oral BID Roselle Locus, MD   5 mg at 11/16/21 1638  ?  hydrOXYzine (ATARAX) tablet 25 mg  25 mg Oral TID PRN Jaclyn Shaggy, PA-C      ? LORazepam (ATIVAN) tablet 1 mg  1 mg Oral Q4H PRN Blase Beckner, Shelbie Hutching, MD      ? magnesium hydroxide (MILK OF MAGNESIA) suspension 30 mL  30 mL Oral Daily PRN Melbourne Abts W, PA-C      ? mirtazapine (REMERON) tablet 7.5 mg  7.5 mg Oral QHS Asaph Serena, Shelbie Hutching, MD   7.5 mg at 11/15/21 2059  ? multivitamin (RENA-VIT) tablet 1 tablet  1 tablet Oral Q0200 Roselle Locus, MD   1 tablet at 11/16/21 0758  ? Vitamin D (Ergocalciferol) (DRISDOL) capsule 50,000 Units  50,000 Units Oral Q7 days Roselle Locus, MD   50,000 Units at 11/15/21 1715  ? ? ?Lab Results:  ?Results for orders placed or performed during the hospital encounter of 11/14/21 (from the past 48 hour(s))  ?Valproic acid level     Status: None  ? Collection Time: 11/15/21  5:00 AM  ?Result Value Ref Range  ? Valproic Acid Lvl 88 50.0 - 100.0 ug/mL  ?  Comment: Performed at [facility], [address] [place], [place] [address]  ?Magnesium     Status: None  ? Collection Time: 11/15/21  5:00 AM  ?Result Value Ref Range  ? Magnesium 2.2 1.7 - 2.4 mg/dL  ?  Comment: Performed at [facility], [address], [place], [place] [address]  ?VITAMIN D 25 Hydroxy (Vit-D Deficiency, Fractures)     Status: Abnormal  ? Collection Time: 11/15/21  5:00 AM  ?Result Value Ref Range  ? Vit D, 25-Hydroxy 14.77 (L) 30 - 100 ng/mL  ?  Comment: (NOTE) ?Vitamin D deficiency has been defined by the Institute of Medicine  ?and an Endocrine Society practice guideline as a level of serum 25-OH  ?vitamin D less than 20 ng/mL (1,2). The Endocrine Society went on to  ?further define vitamin D insufficiency as a level between 21 and 29  ?ng/mL (2). ? ?1. IOM [employer] of Medicine). 2010. Dietary reference intakes for  ?calcium and D. [place]: [employer]. ?2. Holick MF, Binkley [place], Bischoff-Ferrari HA, et al. Evaluation,   ?treatment, and prevention of vitamin D deficiency: an Endocrine  ?Society clinical practice guideline, JCEM. 2011 Jul; 96(7): 1911-30. ? ?Performed at [facility], [address], [place], [place] ?[address] ?  ?Vitamin B12     Status: None  ? Collection Time: 11/15/21  5:00 AM  ?Result Value Ref Range  ? Vitamin B-12 276 180 - 914 pg/mL  ?  Comment: (NOTE) ?This assay is not validated for testing neonatal or ?myeloproliferative  syndrome specimens for Vitamin B12 levels. ?Performed at [facility], [address] ?[place], [place] [address] ?  ?RPR     Status: None  ? Collection Time: 11/15/21  5:00 AM  ?Result Value Ref Range  ? RPR Ser Ql NON REACTIVE NON REACTIVE  ?  Comment: Performed at [facility], [address] [place], [place] [address]  ?Hepatitis panel, acute     Status: None  ? Collection Time: 11/15/21  5:00 AM  ?Result Value Ref Range  ? Hepatitis B Surface Ag NON REACTIVE NON REACTIVE  ? HCV Ab NON REACTIVE NON REACTIVE  ?  Comment: (NOTE) ?Nonreactive HCV antibody screen is consistent with no HCV infections,  ?unless recent infection is suspected or other evidence exists to ?indicate HCV infection. ? ?  ? Hep A IgM NON REACTIVE NON REACTIVE  ? Hep B C IgM NON REACTIVE NON REACTIVE  ?  Comment: Performed at [facility], [address] [place], [place] [address]  ? ? ?Blood Alcohol level:  ?Lab Results  ?Component Value Date  ? ETH <10 11/13/2021  ? ETH <10 06/15/2021  ? ? ?Metabolic Disorder Labs: ?Lab Results  ?Component Value Date  ? HGBA1C 5.7 (H) 11/14/2021  ? MPG 117 11/14/2021  ? ?No results found for: PROLACTIN ?Lab Results  ?Component Value Date  ? CHOL 148 11/14/2021  ? TRIG 47 11/14/2021  ? HDL 47 11/14/2021  ? CHOLHDL 3.1 11/14/2021  ? VLDL 9 11/14/2021  ? LDLCALC 92 11/14/2021  ? ? ?Physical Findings: ?AIMS: Facial and Oral Movements ?Muscles of Facial Expression: None, normal ?Lips and Perioral Area: None, normal ?Jaw: None, normal ?Tongue:  None, normal,Extremity Movements ?Upper (arms, wrists, hands, fingers): None, normal ?Lower (legs, knees, ankles, toes): None, normal, Trunk Movements ?Neck, shoulders, hips: None, normal, Overall Severity ?Sev

## 2021-11-16 NOTE — Group Note (Signed)
Recreation Therapy Group Note ? ? ?Group Topic:Communication  ?Group Date: 11/16/2021 ?Start Time: 1015 ?End Time: 1100 ?Facilitators: Caroll Rancher, LRT,CTRS ?Location: [address] Day[address] ? ? ?Goal Area(s) Addresses:  ?Patient will effectively listen to complete activity.  ?Patient will identify communication skills used to make activity successful.  ?Patient will identify how skills used during activity can be used to reach post d/c goals.  ?  ?Group Description: Geometric Drawings.  Three volunteers from the peer group will be shown an abstract picture with a particular arrangement of geometrical shapes.  Each round, one 'speaker' will describe the pattern, as accurately as possible without revealing the image to the group.  The remaining group members will listen and draw the picture to reflect how it is described to them. Patients with the role of 'listener' cannot ask clarifying questions but, may request that the speaker repeat a direction. Once the drawings are complete, the presenter will show the rest of the group the picture and compare how close each person came to drawing the picture. LRT will facilitate a post-activity discussion regarding effective communication and the importance of planning, listening, and asking for clarification in daily interactions with others. ? ? ?Affect/Mood: Appropriate ?  ?Participation Level: Engaged ?  ?Participation Quality: Independent ?  ?Behavior: Attentive  ?  ?Speech/Thought Process: Focused ?  ?Insight: Moderate ?  ?Judgement: Moderate ?  ?Modes of Intervention: Drawing ?  ?Patient Response to Interventions:  Attentive and Engaged ?  ?Education Outcome: ? Acknowledges education and In group clarification offered   ? ?Clinical Observations/Individualized Feedback: Pt came in late to group.  Pt appeared drowsy as he had just woken up.  After getting instructions for the activity, pt joined in.  Pt was attentive and did a good job presenting his picture.  Pt was quiet  during discussion but attentive.    ? ? ?Plan: Continue to engage patient in RT group sessions 2-3x/week. ? ? ?Caroll Rancher, LRT,CTRS ?11/16/2021 11:28 AM ?

## 2021-11-16 NOTE — Group Note (Signed)
LCSW Group Therapy Note ? ?Group Date: 11/16/2021 ?Start Time: 1100 ?End Time: 1200 ? ? ?Type of Therapy and Topic:  Group Therapy: Anger Cues and Responses ? ?Participation Level:  Active ? ? ?Description of Group:   ?In this group, patients learned how to recognize the physical, cognitive, emotional, and behavioral responses they have to anger-provoking situations.  They identified a recent time they became angry and how they reacted.  They analyzed how their reaction was possibly beneficial and how it was possibly unhelpful.  The group discussed a variety of healthier coping skills that could help with such a situation in the future.  Focus was placed on how helpful it is to recognize the underlying emotions to our anger, because working on those can lead to a more permanent solution as well as our ability to focus on the important rather than the urgent. ? ?Therapeutic Goals: ?Patients will remember their last incident of anger and how they felt emotionally and physically, what their thoughts were at the time, and how they behaved. ?Patients will identify how their behavior at that time worked for them, as well as how it worked against them. ?Patients will explore possible new behaviors to use in future anger situations. ?Patients will learn that anger itself is normal and cannot be eliminated, and that healthier reactions can assist with resolving conflict rather than worsening situations. ? ?Summary of Patient Progress:  Due to lack of staffing patient was provided with written information and worksheets about Anger Management.  CSW was able to review information and answer questions patient had around material provided.  ? ?Therapeutic Modalities:   ?Cognitive Behavioral Therapy ? ? ? ?Ian Aldrete A Willella Harding, LCSW ?11/16/2021  11:04 AM   ? ?

## 2021-11-16 NOTE — Progress Notes (Signed)
?   11/16/21 2030  ?Psych Admission Type (Psych Patients Only)  ?Admission Status Involuntary  ?Psychosocial Assessment  ?Patient Complaints None  ?Eye Contact Fair  ?Facial Expression Sad  ?Affect Apprehensive  ?Speech Soft;Slow  ?Interaction Cautious  ?Motor Activity Slow  ?Appearance/Hygiene In scrubs  ?Behavior Characteristics Cooperative  ?Mood Pleasant  ?Aggressive Behavior  ?Effect No apparent injury  ?Thought Process  ?Coherency WDL  ?Content Blaming others  ?Delusions WDL  ?Perception Hallucinations  ?Hallucination Auditory;Visual  ?Judgment Impaired  ?Confusion WDL  ?Danger to Self  ?Current suicidal ideation? Denies  ?Danger to Others  ?Danger to Others None reported or observed  ? ? ?

## 2021-11-17 NOTE — Progress Notes (Signed)
?   11/17/21 0515  ?Sleep  ?Number of Hours 8.5  ? ? ?

## 2021-11-17 NOTE — Progress Notes (Signed)
Pt remains A&OX4, cooperative, calm, no s/s of pt entertaining unseen others, medication compliant, and without ideations and hallucinations.  ?

## 2021-11-17 NOTE — Group Note (Signed)
Social Work Group Therapy ?11/14/2021 ?3:00pm-4:00pm ? ?  ?Topic:  Mental Health Myths and Facts ?  ?Description of Group:   Due to staff recommendation and patient confinement to the hall and subsequent inability to enter the dayroom, psychotherapy group was not held.  Patient was provided with written information about mental health myths and facts.  Patient did not ask questions about the handoff. ? ?Xabi Wittler Grossman-Orr, MSW, LCSW ?11/14/2021 4:00pm ?

## 2021-11-17 NOTE — Progress Notes (Signed)
?   11/17/21 2000  ?Psych Admission Type (Psych Patients Only)  ?Admission Status Involuntary  ?Psychosocial Assessment  ?Patient Complaints None  ?Eye Contact Fair  ?Facial Expression Sad  ?Affect Apprehensive  ?Speech Soft;Slow  ?Interaction Cautious  ?Motor Activity Slow  ?Appearance/Hygiene In scrubs  ?Behavior Characteristics Cooperative  ?Mood Euthymic  ?Aggressive Behavior  ?Effect No apparent injury  ?Thought Process  ?Coherency WDL  ?Content Blaming others  ?Delusions WDL  ?Perception Hallucinations  ?Hallucination Auditory;Visual  ?Judgment Impaired  ?Confusion WDL  ?Danger to Self  ?Current suicidal ideation? Denies  ?Danger to Others  ?Danger to Others None reported or observed  ? ? ?

## 2021-11-17 NOTE — [facility] Group Notes (Signed)
.  Psychoeducational Group Note ? ? ? ?Date:  4/8//23 ?Time: 1100-1200 ? ? ? ?Purpose of Group: . The group focus' on teaching patients on how to identify their needs and their Life Skills:  Key group where two lists are made. What people need and what are things that we do that are unhealthy. The lists are developed by the patients and it is explained that we often do the actions that are not healthy to get our list of needs met. ? ?Goal:: to develop the coping skills needed to get their needs met ? ?Group not done due to acuity of the unit. ? ?Ian Key ? ?

## 2021-11-17 NOTE — Progress Notes (Signed)
Pt is A&OX4, calm, denies suicidal ideations, homicidal ideations, auditory hallucinations and visual hallucinations. Pt denies experiencing nightmares. Mood and affect are congruent. Pt appetite is ok. No complaints of distress, pain and/or discomfort at this time. Pt's memory appears to be grossly intact, and Pt hasn?t displayed any injurious behaviors. Pt is medication compliant. There?s no evidence of suicidal intent. Psychomotor activity was WNL. No s/s of Parkinson, Dystonia, Akathisia and/or Tardive Dyskinesia noted.   ?

## 2021-11-17 NOTE — Progress Notes (Signed)
[facility] MD Progress Note ? ?11/17/2021 4:24 PM ?Ian Key  ?MRN:  [id number] ? ?History of Present Illness: [name] is a 31 YO M brought in on IVC by mother. [ ]  ?            Per chart/EMR review: the patient had made several statements to mother about wanting to be dead on the 2022-11-20. There was some effort made to obtain a firearm but was unsuccessful. He has previously been diagnosed with Schizoaffective/bipolar and [facility] in [place] a few years ago), but has been medication non-compliant recently, and using cannabis at home. He has not been sleeping, aggressive with younger sibling and responding to internal stimuli.  ? ?24 EMR reviewed. Case discussed in progression rounds. Per nursing report, the patient denied having hallucinations.  He did not go to group yesterday evening. He isolates to self ?Slept 8.5 hours ? ?Subjective:  on interview, the patient is unable to sit down and paces back and forth in the small room. He tends to come too close to this interviewer and stand overtop in an uncomfortable manner. He is evasive with some subjects, mostly about future plans outside the hospital. He appears to be paranoid. He also appears to be disingenuous with his answers. He feels that he is doing "okay" and has an odd, static smile on his face that could be described as doll-like. He denies hallucinations, thoughts of harm to self or others. When asked if he still plans to get a gun, he states "I plan to line it up better" and describes taking some classes and then obtaining one through a circuitous route. He blames his mother for his frequent hospitalizations, and his lack of access to his own car and housing. He admits that he stops taking his medications, but takes no ownership of this. He plans to return to living with mother and grandmother and splitting time between the two.  ? ?Principal Problem: Schizoaffective disorder, bipolar type ([place]) ?Diagnosis:  Principal Problem: ?  Schizoaffective disorder, bipolar type ([place]) ?Active Problems: ?  Marijuana use ?  Housing problems ?  Anxiety ?  Tobacco use disorder ?  Vitamin D deficiency ? ?Total Time spent with patient: 45 minutes ? ?Past Psychiatric History:  previously been diagnosed with Schizoaffective/bipolar and treated at [facility], three hospitalizations in [place] a few years ago ?Outpatient is at the [facility] in [place] ?Has been on depakote and risperdal ? ?Past Medical History: History reviewed. No pertinent past medical history. History reviewed. No pertinent surgical history. ?Family History: History reviewed. No pertinent family history. ?Family Psychiatric  History: n/a ?Social History:  ?Social History  ? ?Substance and Sexual Activity  ?Alcohol Use Yes  ?   ?Social History  ? ?Substance and Sexual Activity  ?Drug Use Yes  ? Types: Marijuana  ?  ?Social History  ? ?Socioeconomic History  ? Marital status: Single  ?  Spouse name: Not on file  ? Number of children: Not on file  ? Years of education: Not on file  ? Highest education level: Not on file  ?Occupational History  ? Not on file  ?Tobacco Use  ? Smoking status: Some Days  ?  Packs/day: 0.25  ?  Years: 12.00  ?  Pack years: 3.00  ?  Types: Cigarettes  ? Smokeless tobacco: Never  ?Vaping Use  ? Vaping Use: Never used  ?Substance and Sexual Activity  ? Alcohol use: Yes  ?  Drug use: Yes  ?  Types: Marijuana  ? Sexual activity: Yes  ?  Birth control/protection: Condom  ?Other Topics Concern  ? Not on file  ?Social History Narrative  ? Not on file  ? ?Social Determinants of Health  ? ?Financial Resource Strain: Not on file  ?Food Insecurity: Not on file  ?Transportation Needs: Not on file  ?Physical Activity: Not on file  ?Stress: Not on file  ?Social Connections: Not on file  ? ?Additional Social History:  ?  ?  ?  ?  ?  ?  ?  ?  ?  ?  ?  ? ?Sleep:  increased ? ?Appetite:  Good ? ?Current Medications: ?Current Facility-Administered  Medications  ?Medication Dose Route Frequency Provider Last Rate Last Admin  ? acetaminophen (TYLENOL) tablet 650 mg  650 mg Oral Q6H PRN Prescilla Sours, PA-C      ? alum & mag hydroxide-simeth (MAALOX/MYLANTA) 200-200-20 MG/5ML suspension 30 mL  30 mL Oral Q4H PRN Margorie John W, PA-C      ? haloperidol (HALDOL) tablet 2 mg  2 mg Oral QAC breakfast Karington Zarazua, Jackie Plum, MD   2 mg at 11/17/21 [id number]  ? haloperidol (HALDOL) tablet 5 mg  5 mg Oral QHS Zaydah Nawabi, Jackie Plum, MD   5 mg at 11/16/21 2050  ? hydrOXYzine (ATARAX) tablet 25 mg  25 mg Oral TID PRN Prescilla Sours, PA-C      ? LORazepam (ATIVAN) tablet 1 mg  1 mg Oral Q4H PRN Augusto Deckman, Jackie Plum, MD      ? magnesium hydroxide (MILK OF MAGNESIA) suspension 30 mL  30 mL Oral Daily PRN Margorie John W, PA-C      ? mirtazapine (REMERON) tablet 7.5 mg  7.5 mg Oral QHS Deyon Chizek, Jackie Plum, MD   7.5 mg at 11/16/21 2050  ? multivitamin (RENA-VIT) tablet 1 tablet  1 tablet Oral Q0200 Nicholes Rough, NP   1 tablet at 11/17/21 [id number]  ? Vitamin D (Ergocalciferol) (DRISDOL) capsule 50,000 Units  50,000 Units Oral Q7 days Maida Sale, MD   50,000 Units at 11/15/21 1715  ? ? ?Lab Results:  ?No results found for this or any previous visit (from the past 48 hour(s)). ? ? ?Blood Alcohol level:  ?Lab Results  ?Component Value Date  ? ETH <10 11/13/2021  ? ETH <10 06/15/2021  ? ? ?Metabolic Disorder Labs: ?Lab Results  ?Component Value Date  ? HGBA1C 5.7 (H) 11/14/2021  ? MPG 117 11/14/2021  ? ?No results found for: PROLACTIN ?Lab Results  ?Component Value Date  ? CHOL 148 11/14/2021  ? TRIG 47 11/14/2021  ? HDL 47 11/14/2021  ? CHOLHDL 3.1 11/14/2021  ? VLDL 9 11/14/2021  ? [place] 92 11/14/2021  ? ? ?Physical Findings: ?AIMS: Facial and Oral Movements ?Muscles of Facial Expression: None, normal ?Lips and Perioral Area: None, normal ?Jaw: None, normal ?Tongue: None, normal,Extremity Movements ?Upper (arms, wrists, hands, fingers): None, normal ?Lower (legs, knees,  ankles, toes): None, normal, Trunk Movements ?Neck, shoulders, hips: None, normal, Overall Severity ?Severity of abnormal movements (highest score from questions above): None, normal ?Incapacitation due to abnormal movements: None, normal ?Patient's awareness of abnormal movements (rate only patient's report): No Awareness, Dental Status ?Current problems with teeth and/or dentures?: No ?Does patient usually wear dentures?: No  ?CIWA:  CIWA-Ar Total: 0 ?COWS:    ? ?Musculoskeletal: ?Strength & Muscle Tone: within normal limits ?Gait & Station: normal ?Patient leans: N/A ? ?Psychiatric Specialty Exam: ? ?  Presentation  ?General Appearance: Disheveled ? ?Eye Contact:Fleeting ? ?Speech:Normal Rate ? ?Speech Volume:Normal ? ?Handedness:No data recorded ? ?Mood and Affect  ?Mood:Anxious; Labile ? ?Affect:Non-Congruent (odd, static smile) ? ? ?Thought Process  ?Thought Processes:Goal Directed ? ?Descriptions of Associations:Loose ? ?Orientation:Partial ? ?Thought Content:Paranoid Ideation; Rumination ? ?History of Schizophrenia/Schizoaffective disorder:Yes ? ?Duration of Psychotic Symptoms:Greater than six months ? ?Hallucinations:Hallucinations: -- (denies) ? ?Ideas of Reference:Paranoia ? ?Suicidal Thoughts:Suicidal Thoughts: No ? ?Homicidal Thoughts:Homicidal Thoughts: -- (denies) ? ? ?Sensorium  ?Memory:Immediate Fair; Remote Poor ? ?Judgment:Poor ? ?Insight:Poor ? ? ?Executive Functions  ?Concentration:Poor ? ?Attention Span:Poor ? ?Recall:Fair ? ?[place] ? ?Language:Fair ? ? ?Psychomotor Activity  ?Psychomotor Activity:Psychomotor Activity: Increased; Restlessness (pacing room) ? ? ?Assets  ?Assets:Physical Health ? ? ?Sleep  ?Sleep:Sleep: Fair ? ? ? ?Physical Exam: ?Physical Exam ?Vitals and nursing note reviewed.  ?Constitutional:   ?   Comments: thin  ?HENT:  ?   Head: Normocephalic.  ?Eyes:  ?   Extraocular Movements: Extraocular movements intact.  ?Pulmonary:  ?   Effort: Pulmonary effort is  normal.  ?Musculoskeletal:     ?   General: Normal range of motion.  ?   Cervical back: Normal range of motion.  ?Neurological:  ?   General: No focal deficit present.  ?   Mental Status: He is alert.  ? ?Review of Sy

## 2021-11-18 MED ORDER — HALOPERIDOL 5 MG PO TABS
7.5000 mg | ORAL_TABLET | Freq: Every day | ORAL | Status: DC
  Administered 2021-11-18 – 2021-11-20 (×3): 7.5 mg via ORAL
  Filled 2021-11-18 (×5): qty 1

## 2021-11-18 NOTE — Progress Notes (Signed)
?   11/18/21 0515  ?Sleep  ?Number of Hours 9.5  ? ? ?

## 2021-11-18 NOTE — Progress Notes (Signed)
[facility] MD Progress Note ? ?11/18/2021 5:51 PM ?Ian Key  ?MRN:  [id number] ? ?History of Present Illness: Ian Key is a 31 YO M brought in on IVC by mother. [ ]  ?            Per chart/EMR review: the patient had made several statements to mother about wanting to be dead on the 11-15-22. There was some effort made to obtain a firearm but was unsuccessful. He has previously been diagnosed with Schizoaffective/bipolar and [facility] [place] a few years ago), but has been medication non-compliant recently, and using cannabis at home. He has not been sleeping, aggressive with younger sibling and responding to internal stimuli.  ? ?24 EMR reviewed. Case discussed in progression rounds. Per nursing report, the patient denied having hallucinations.  He appeared sad.  ?Slept 9.5 hours ? ?Subjective:  on interview, the patient spends the first half seated and facing away from the interviewer. He say very little initially he says his mood is "decent". He feels that he is sleeping and eating "good". He talked to his mother and she brought him clothes. He denies hallucinations, thoughts of harm to self or others today. I ask him about the neighbor and he laughs. After discussing mutual goals of finding ways to stay out of the hospital, he admits that he is frustrated with not being able to keep a job. He also expresses frustration with not understanding why his mother keeps putting him under an IVC. He feels that her reasons for having him committed seem trivial to him.  ? We practice some problem solving, very basic black and white choices that do not present any bias with regards to subjective reality testing, which appears to be a difficult and contentious subject for Ian Key. He appears to internalize at least some of it, which is that some of his behaviors will put him at risk of being IVC'd if he continues to act this way in front of people that will not tolerate them. Which  means he must either control them, or find an environment where these behaviors would be acceptable.  ? ?Principal Problem: Schizoaffective disorder, bipolar type ([facility]) ?Diagnosis: Principal Problem: ?  Schizoaffective disorder, bipolar type ([facility]) ?Active Problems: ?  Marijuana use ?  Housing problems ?  Anxiety ?  Tobacco use disorder ?  Vitamin D deficiency ? ?Total Time spent with patient: 45 minutes ? ?Past Psychiatric History:  previously been diagnosed with Schizoaffective/bipolar and treated at [facility], three hospitalizations in [place] a few years ago ?Outpatient is at the [place] in [place] ?Has been on depakote and risperdal ? ?Past Medical History: History reviewed. No pertinent past medical history. History reviewed. No pertinent surgical history. ?Family History: History reviewed. No pertinent family history. ?Family Psychiatric  History: n/a ?Social History:  ?Social History  ? ?Substance and Sexual Activity  ?Alcohol Use Yes  ?   ?Social History  ? ?Substance and Sexual Activity  ?Drug Use Yes  ? Types: Marijuana  ?  ?Social History  ? ?Socioeconomic History  ? Marital status: Single  ?  Spouse name: Not on file  ? Number of children: Not on file  ? Years of education: Not on file  ? Highest education level: Not on file  ?Occupational History  ? Not on file  ?Tobacco Use  ? Smoking status: Some Days  ?  Packs/day: 0.25  ?  Years: 12.00  ?  Pack  years: 3.00  ?  Types: Cigarettes  ? Smokeless tobacco: Never  ?Vaping Use  ? Vaping Use: Never used  ?Substance and Sexual Activity  ? Alcohol use: Yes  ? Drug use: Yes  ?  Types: Marijuana  ? Sexual activity: Yes  ?  Birth control/protection: Condom  ?Other Topics Concern  ? Not on file  ?Social History Narrative  ? Not on file  ? ?Social Determinants of Health  ? ?Financial Resource Strain: Not on file  ?Food Insecurity: Not on file  ?Transportation Needs: Not on file  ?Physical Activity: Not on file  ?Stress: Not on file  ?Social  Connections: Not on file  ? ?Additional Social History:  ?  ?  ?  ?  ?  ?  ?  ?  ?  ?  ?  ? ?Sleep:  increased ? ?Appetite:  Good ? ?Current Medications: ?Current Facility-Administered Medications  ?Medication Dose Route Frequency Provider Last Rate Last Admin  ? acetaminophen (TYLENOL) tablet 650 mg  650 mg Oral Q6H PRN Jaclyn Shaggyaylor, Cody W, PA-C      ? alum & mag hydroxide-simeth (MAALOX/MYLANTA) 200-200-20 MG/5ML suspension 30 mL  30 mL Oral Q4H PRN Melbourne Abtsaylor, Cody W, PA-C      ? haloperidol (HALDOL) tablet 2 mg  2 mg Oral QAC breakfast Afshin Chrystal, Shelbie HutchingStephanie Leigh, MD   2 mg at 11/18/21 [id number]  ? haloperidol (HALDOL) tablet 5 mg  5 mg Oral QHS Sharyn Brilliant, Shelbie HutchingStephanie Leigh, MD   5 mg at 11/17/21 2027  ? hydrOXYzine (ATARAX) tablet 25 mg  25 mg Oral TID PRN Jaclyn Shaggyaylor, Cody W, PA-C      ? LORazepam (ATIVAN) tablet 1 mg  1 mg Oral Q4H PRN Everline Mahaffy, Shelbie HutchingStephanie Leigh, MD      ? magnesium hydroxide (MILK OF MAGNESIA) suspension 30 mL  30 mL Oral Daily PRN Melbourne Abtsaylor, Cody W, PA-C      ? mirtazapine (REMERON) tablet 7.5 mg  7.5 mg Oral QHS Daria Mcmeekin, Shelbie HutchingStephanie Leigh, MD   7.5 mg at 11/17/21 2027  ? multivitamin (RENA-VIT) tablet 1 tablet  1 tablet Oral Q0200 Starleen BlueNkwenti, Doris, NP   1 tablet at 11/18/21 0809  ? Vitamin D (Ergocalciferol) (DRISDOL) capsule 50,000 Units  50,000 Units Oral Q7 days Roselle LocusHill, Jolee Critcher Leigh, MD   50,000 Units at 11/15/21 1715  ? ? ?Lab Results:  ?No results found for this or any previous visit (from the past 48 hour(s)). ? ? ?Blood Alcohol level:  ?Lab Results  ?Component Value Date  ? ETH <10 11/13/2021  ? ETH <10 06/15/2021  ? ? ?Metabolic Disorder Labs: ?Lab Results  ?Component Value Date  ? HGBA1C 5.7 (H) 11/14/2021  ? MPG 117 11/14/2021  ? ?No results found for: PROLACTIN ?Lab Results  ?Component Value Date  ? CHOL 148 11/14/2021  ? TRIG 47 11/14/2021  ? HDL 47 11/14/2021  ? CHOLHDL 3.1 11/14/2021  ? VLDL 9 11/14/2021  ? LDLCALC 92 11/14/2021  ? ? ?Physical Findings: ?AIMS: Facial and Oral Movements ?Muscles of Facial Expression:  None, normal ?Lips and Perioral Area: None, normal ?Jaw: None, normal ?Tongue: None, normal,Extremity Movements ?Upper (arms, wrists, hands, fingers): None, normal ?Lower (legs, knees, ankles, toes): None, normal, Trunk Movements ?Neck, shoulders, hips: None, normal, Overall Severity ?Severity of abnormal movements (highest score from questions above): None, normal ?Incapacitation due to abnormal movements: None, normal ?Patient's awareness of abnormal movements (rate only patient's report): No Awareness, Dental Status ?Current problems with teeth and/or dentures?: No ?Does patient usually wear  dentures?: No  ?CIWA:  CIWA-Ar Total: 0 ?COWS:    ? ?Musculoskeletal: ?Strength & Muscle Tone: within normal limits ?Gait & Station: normal ?Patient leans: N/A ? ?Psychiatric Specialty Exam: ? ?Presentation  ?General Appearance: Appropriate for Environment; Casual ? ?Eye Contact:Minimal ? ?Speech:Normal Rate ? ?Speech Volume:Normal ? ?Handedness:No data recorded ? ?Mood and Affect  ?Mood:Anxious; Labile ? ?Affect:Inappropriate ? ? ?Thought Process  ?Thought Processes:-- (partly organized) ? ?Descriptions of Associations:Loose ? ?Orientation:Partial ? ?Thought Content:Paranoid Ideation; Rumination ? ?History of Schizophrenia/Schizoaffective disorder:Yes ? ?Duration of Psychotic Symptoms:Greater than six months ? ?Hallucinations:Hallucinations: None ? ?Ideas of Reference:Paranoia ? ?Suicidal Thoughts:Suicidal Thoughts: No ? ?Homicidal Thoughts:Homicidal Thoughts: No ? ? ?Sensorium  ?Memory:Remote Poor; Immediate Poor ? ?Judgment:Poor ? ?Insight:Poor ? ? ?Executive Functions  ?Concentration:Poor ? ?Attention Span:Poor ? ?Recall:Poor ? ?Fund of Knowledge:Fair ? ?Language:Fair ? ? ?Psychomotor Activity  ?Psychomotor Activity:Psychomotor Activity: Restlessness ? ? ?Assets  ?Assets:Physical Health; Leisure Time ? ? ?Sleep  ?Sleep:Sleep: Fair ? ? ? ?Physical Exam: ?Physical Exam ?Vitals and nursing note reviewed.  ?Constitutional:    ?   Comments: thin  ?HENT:  ?   Head: Normocephalic.  ?Eyes:  ?   Extraocular Movements: Extraocular movements intact.  ?Pulmonary:  ?   Effort: Pulmonary effort is normal.  ?Musculoskeletal:     ?   General: Norm

## 2021-11-18 NOTE — Group Note (Signed)
[facility] LCSW Group Therapy Note ? ?Date/Time:  11/18/2021  11:00AM-12:00PM ? ?Type of Therapy and Topic:  Group Therapy:  Music and Mood ? ?Participation Level:  Active  ? ?Description of Group: ?In this process group, members listened to a variety of genres of music and identified that different types of music evoke different responses.  Patients were encouraged to identify music that was soothing for them and music that was energizing for them.  Patients discussed how this knowledge can help with wellness and recovery in various ways including managing depression and anxiety as well as encouraging healthy sleep habits.   ? ?Therapeutic Goals: ?Patients will explore the impact of different varieties of music on mood ?Patients will verbalize the thoughts they have when listening to different types of music ?Patients will identify music that is soothing to them as well as music that is energizing to them ?Patients will discuss how to use this knowledge to assist in maintaining wellness and recovery ?Patients will explore the use of music as a coping skill ? ?Summary of Patient Progress:  At the beginning of group, patient expressed he was experiencing anxiety (5 out of 10) and fatigue (4 out of 10).  He participated fully throughout group, commented on his feelings at the songs, and smiled broadly.  At the end of group, patient expressed his mood was improved, with his anxiety at a 4 out of 10 and fatigue at a 2 out of 10.   ? ?Therapeutic Modalities: ?Solution Focused Brief Therapy ?Activity ? ? ?Ambrose Mantle, LCSW ?  ?

## 2021-11-18 NOTE — Progress Notes (Signed)
Pt is A&OX4, calm, fixated smile, denies suicidal ideations, denies homicidal ideations, denies auditory hallucinations and denies visual hallucinations. Pt verbally agrees to approach staff if these become apparent and before harming self or others. Pt denies experiencing nightmares. Mood and affect are congruent. Pt appetite is ok. No complaints of anxiety, distress, pain and/or discomfort at this time. Pt's memory appears to be grossly intact, and Pt hasn?t displayed any injurious behaviors. Pt is medication compliant. There?s no evidence of suicidal intent. Psychomotor activity was WNL. No s/s of Parkinson, Dystonia, Akathisia and/or Tardive Dyskinesia noted.   ? ? ?

## 2021-11-18 NOTE — Progress Notes (Signed)
Kaimana was pleasant and cooperative.  He remained isolative to his room but did come out for snack and bedtime medications.  He denied SI/HI or AVH.  He denied any pain or discomfort and appeared to be in no physical distress.  Q 15 minute checks maintained for safety.  He remains safe on the unit. ? ? 11/18/21 2058  ?Psych Admission Type (Psych Patients Only)  ?Admission Status Involuntary  ?Psychosocial Assessment  ?Patient Complaints None  ?Eye Contact Fair  ?Facial Expression Fixed smile  ?Affect Flat  ?Speech Logical/coherent  ?Interaction Isolative  ?Motor Activity Other (Comment) ?(unremarkable)  ?Appearance/Hygiene In scrubs;Unremarkable  ?Behavior Characteristics Cooperative;Appropriate to situation  ?Mood Euthymic  ?Thought Process  ?Coherency WDL  ?Content Blaming others  ?Delusions WDL  ?Perception WDL  ?Hallucination None reported or observed  ?Judgment WDL  ?Confusion WDL  ?Danger to Self  ?Current suicidal ideation? Denies  ?Danger to Others  ?Danger to Others None reported or observed  ? ? ?

## 2021-11-19 ENCOUNTER — Encounter (HOSPITAL_COMMUNITY): Payer: Self-pay

## 2021-11-19 MED ORDER — MIRTAZAPINE 15 MG PO TABS
15.0000 mg | ORAL_TABLET | Freq: Every day | ORAL | Status: DC
Start: 2021-11-19 — End: 2021-11-21
  Administered 2021-11-19 – 2021-11-20 (×2): 15 mg via ORAL
  Filled 2021-11-19 (×4): qty 1

## 2021-11-19 NOTE — BH IP Treatment Plan (Signed)
Interdisciplinary Treatment and Diagnostic Plan Update ? ?11/19/2021 ?Time of Session: 9:30am ?Ian Key ?MRN: [id number] ? ?Principal Diagnosis: Schizoaffective disorder, bipolar type (HCC) ? ?Secondary Diagnoses: Principal Problem: ?  Schizoaffective disorder, bipolar type (HCC) ?Active Problems: ?  Marijuana use ?  Housing problems ?  Anxiety ?  Tobacco use disorder ?  Vitamin D deficiency ? ? ?Current Medications:  ?Current Facility-Administered Medications  ?Medication Dose Route Frequency Provider Last Rate Last Admin  ? acetaminophen (TYLENOL) tablet 650 mg  650 mg Oral Q6H PRN Jaclyn Shaggyaylor, Cody W, PA-C      ? alum & mag hydroxide-simeth (MAALOX/MYLANTA) 200-200-20 MG/5ML suspension 30 mL  30 mL Oral Q4H PRN Melbourne Abtsaylor, Cody W, PA-C      ? haloperidol (HALDOL) tablet 2 mg  2 mg Oral QAC breakfast Hill, Shelbie HutchingStephanie Leigh, MD   2 mg at 11/19/21 [id number]  ? haloperidol (HALDOL) tablet 7.5 mg  7.5 mg Oral QHS Hill, Shelbie HutchingStephanie Leigh, MD   7.5 mg at 11/18/21 2052  ? hydrOXYzine (ATARAX) tablet 25 mg  25 mg Oral TID PRN Jaclyn Shaggyaylor, Cody W, PA-C      ? LORazepam (ATIVAN) tablet 1 mg  1 mg Oral Q4H PRN Hill, Shelbie HutchingStephanie Leigh, MD      ? magnesium hydroxide (MILK OF MAGNESIA) suspension 30 mL  30 mL Oral Daily PRN Melbourne Abtsaylor, Cody W, PA-C      ? mirtazapine (REMERON) tablet 7.5 mg  7.5 mg Oral QHS Hill, Shelbie HutchingStephanie Leigh, MD   7.5 mg at 11/18/21 2051  ? multivitamin (RENA-VIT) tablet 1 tablet  1 tablet Oral Q0200 Starleen BlueNkwenti, Doris, NP   1 tablet at 11/19/21 0736  ? Vitamin D (Ergocalciferol) (DRISDOL) capsule 50,000 Units  50,000 Units Oral Q7 days Roselle LocusHill, Stephanie Leigh, MD   50,000 Units at 11/15/21 1715  ? ?PTA Medications: ?Medications Prior to Admission  ?Medication Sig Dispense Refill Last Dose  ? divalproex (DEPAKOTE) 250 MG DR tablet Take 250 mg by mouth See admin instructions. 250 mg in the morning  ?500 mg at bedtime (Patient not taking: Reported on 11/13/2021)     ? risperiDONE (RISPERDAL) 3 MG tablet Take 3 mg by mouth 2 (two) times  daily. (Patient not taking: Reported on 11/13/2021)     ? ? ?Patient Stressors: Marital or family conflict   ?Medication change or noncompliance   ? ?Patient Strengths: General fund of knowledge  ?Motivation for treatment/growth  ?Supportive family/friends  ? ?Treatment Modalities: Medication Management, Group therapy, Case management,  ?1 to 1 session with clinician, Psychoeducation, Recreational therapy. ? ? ?Physician Treatment Plan for Primary Diagnosis: Schizoaffective disorder, bipolar type (HCC) ?Long Term Goal(s): Improvement in symptoms so as ready for discharge  ? ?Short Term Goals: Ability to identify changes in lifestyle to reduce recurrence of condition will improve ?Ability to verbalize feelings will improve ?Ability to disclose and discuss suicidal ideas ?Ability to demonstrate self-control will improve ?Ability to identify and develop effective coping behaviors will improve ?Ability to maintain clinical measurements within normal limits will improve ?Compliance with prescribed medications will improve ?Ability to identify triggers associated with substance abuse/mental health issues will improve ? ?Medication Management: Evaluate patient's response, side effects, and tolerance of medication regimen. ? ?Therapeutic Interventions: 1 to 1 sessions, Unit Group sessions and Medication administration. ? ?Evaluation of Outcomes: Progressing ? ?Physician Treatment Plan for Secondary Diagnosis: Principal Problem: ?  Schizoaffective disorder, bipolar type (HCC) ?Active Problems: ?  Marijuana use ?  Housing problems ?  Anxiety ?  Tobacco use disorder ?  Vitamin D deficiency ? ?Long Term Goal(s): Improvement in symptoms so as ready for discharge  ? ?Short Term Goals: Ability to identify changes in lifestyle to reduce recurrence of condition will improve ?Ability to verbalize feelings will improve ?Ability to disclose and discuss suicidal ideas ?Ability to demonstrate self-control will improve ?Ability to identify  and develop effective coping behaviors will improve ?Ability to maintain clinical measurements within normal limits will improve ?Compliance with prescribed medications will improve ?Ability to identify triggers associated with substance abuse/mental health issues will improve    ? ?Medication Management: Evaluate patient's response, side effects, and tolerance of medication regimen. ? ?Therapeutic Interventions: 1 to 1 sessions, Unit Group sessions and Medication administration. ? ?Evaluation of Outcomes: Progressing ? ? ?RN Treatment Plan for Primary Diagnosis: Schizoaffective disorder, bipolar type (HCC) ?Long Term Goal(s): Knowledge of disease and therapeutic regimen to maintain health will improve ? ?Short Term Goals: Ability to remain free from injury will improve, Ability to verbalize frustration and anger appropriately will improve, Ability to demonstrate self-control, Ability to identify and develop effective coping behaviors will improve, and Compliance with prescribed medications will improve ? ?Medication Management: RN will administer medications as ordered by provider, will assess and evaluate patient's response and provide education to patient for prescribed medication. RN will report any adverse and/or side effects to prescribing provider. ? ?Therapeutic Interventions: 1 on 1 counseling sessions, Psychoeducation, Medication administration, Evaluate responses to treatment, Monitor vital signs and CBGs as ordered, Perform/monitor CIWA, COWS, AIMS and Fall Risk screenings as ordered, Perform wound care treatments as ordered. ? ?Evaluation of Outcomes: Progressing ? ? ?LCSW Treatment Plan for Primary Diagnosis: Schizoaffective disorder, bipolar type (HCC) ?Long Term Goal(s): Safe transition to appropriate next level of care at discharge, Engage patient in therapeutic group addressing interpersonal concerns. ? ?Short Term Goals: Engage patient in aftercare planning with referrals and resources, Increase  social support, Increase ability to appropriately verbalize feelings, Identify triggers associated with mental health/substance abuse issues, and Increase skills for wellness and recovery ? ?Therapeutic Interventions: Assess for all discharge needs, 1 to 1 time with Child psychotherapist, Explore available resources and support systems, Assess for adequacy in community support network, Educate family and significant other(s) on suicide prevention, Complete Psychosocial Assessment, Interpersonal group therapy. ? ?Evaluation of Outcomes: Progressing ? ? ?Progress in Treatment: ?Attending groups: No. ?Participating in groups: No. ?Taking medication as prescribed: Yes. ?Toleration medication: Yes. ?Family/Significant other contact made: Yes, individual(s) contacted:  Mother  ?Patient understands diagnosis: No. ?Discussing patient identified problems/goals with staff: Yes. ?Medical problems stabilized or resolved: Yes. ?Denies suicidal/homicidal ideation: Yes. ?Issues/concerns per patient self-inventory: No. ?  ?  ?New problem(s) identified: No, Describe:  None  ?  ?New Short Term/Long Term Goal(s): medication stabilization, elimination of SI thoughts, development of comprehensive mental wellness plan. ?  ?Patient Goals: "To get all the hurt feelings out of the way"  ?  ?Discharge Plan or Barriers: Patient is to return home to live with mother. Pt is to follow up with the [facility] for medication management and therapy.  ?  ?Reason for Continuation of Hospitalization: Delusions  ?Hallucinations ?Medication stabilization ?  ?Estimated Length of Stay: 3 to 5 days  ?  ? ?Last 3 [place] Suicide Severity Risk Score: ?Flowsheet Row [facility] (Current) [facility]13/2021 in [facility] from 06/15/2021 in [facility]  ?C-SSRS RISK CATEGORY No Risk No Risk No Risk  ? ?  ? ? ?  Last PHQ 2/9 Scores: ?    ? View : No data to display.  ?  ?  ?  ? ? ?Scribe for Treatment Team: ?Otelia Santee, LCSW ?11/19/2021 ?10:23 AM ? ? ?

## 2021-11-19 NOTE — Progress Notes (Signed)
[facility] MD Progress Note ? ?11/19/2021 6:04 PM ?Ian Key  ?MRN:  [id number] ? ?History of Present Illness: Ian Key is a 31 YO M brought in on IVC by mother. [ ]  ?            Per chart/EMR review: the patient had made several statements to mother about wanting to be dead on the November 16, 2022. There was some effort made to obtain a firearm but was unsuccessful. He has previously been diagnosed with Schizoaffective/bipolar and [facility] [place] a few years ago), but has been medication non-compliant recently, and using cannabis at home. He has not been sleeping, aggressive with younger sibling and responding to internal stimuli.  ? ?24 EMR reviewed. Case discussed in progression rounds. Per nursing report, the patient denied having hallucinations.  He is coming to groups ?Slept 6 hours ? ?Subjective:  on interview, the patient is more interactive. He still has a difficult time with reality testing around previously held delusions (people calling him names) but is agreeing that he doesn't need to have a gun in the house at this time. He is not giving up on being a cop or a soldier of some kind. Is planning to return to staying with his mother. Insight into the inherent conflict that remains in that relationship has not really improved, but he has at least looked into a back up plan for housing if this should fall through. He feels that he is more in control of himself. He is able to discuss some improvements between today and when he came in. He is able to agree to take haldol after discharge. He is sleeping and eating regularly. Denies hallucinations, thoughts of harm to self or others.  ? ?Principal Problem: Schizoaffective disorder, bipolar type ([place]) ?Diagnosis: Principal Problem: ?  Schizoaffective disorder, bipolar type ([place]) ?Active Problems: ?  Marijuana use ?  Housing problems ?  Anxiety ?  Tobacco use disorder ?  Vitamin D deficiency ? ?Total Time spent with  patient: 45 minutes ? ?Past Psychiatric History:  previously been diagnosed with Schizoaffective/bipolar and treated at [facility], three hospitalizations in [place] a few years ago ?Outpatient is at the [place] in [place] ?Has been on depakote and risperdal ? ?Past Medical History: History reviewed. No pertinent past medical history. History reviewed. No pertinent surgical history. ?Family History: History reviewed. No pertinent family history. ?Family Psychiatric  History: n/a ?Social History:  ?Social History  ? ?Substance and Sexual Activity  ?Alcohol Use Yes  ?   ?Social History  ? ?Substance and Sexual Activity  ?Drug Use Yes  ? Types: Marijuana  ?  ?Social History  ? ?Socioeconomic History  ? Marital status: Single  ?  Spouse name: Not on file  ? Number of children: Not on file  ? Years of education: Not on file  ? Highest education level: Not on file  ?Occupational History  ? Not on file  ?Tobacco Use  ? Smoking status: Some Days  ?  Packs/day: 0.25  ?  Years: 12.00  ?  Pack years: 3.00  ?  Types: Cigarettes  ? Smokeless tobacco: Never  ?Vaping Use  ? Vaping Use: Never used  ?Substance and Sexual Activity  ? Alcohol use: Yes  ? Drug use: Yes  ?  Types: Marijuana  ? Sexual activity: Yes  ?  Birth control/protection: Condom  ?Other Topics Concern  ? Not on file  ?Social History Narrative  ?  Not on file  ? ?Social Determinants of Health  ? ?Financial Resource Strain: Not on file  ?Food Insecurity: Not on file  ?Transportation Needs: Not on file  ?Physical Activity: Not on file  ?Stress: Not on file  ?Social Connections: Not on file  ? ?Additional Social History:  ?  ?  ?  ?  ?  ?  ?  ?  ?  ?  ?  ? ?Sleep:  increased ? ?Appetite:  Good ? ?Current Medications: ?Current Facility-Administered Medications  ?Medication Dose Route Frequency Provider Last Rate Last Admin  ? acetaminophen (TYLENOL) tablet 650 mg  650 mg Oral Q6H PRN Prescilla Sours, PA-C      ? alum & mag hydroxide-simeth  (MAALOX/MYLANTA) 200-200-20 MG/5ML suspension 30 mL  30 mL Oral Q4H PRN Margorie John W, PA-C      ? haloperidol (HALDOL) tablet 2 mg  2 mg Oral QAC breakfast Jenin Birdsall, Jackie Plum, MD   2 mg at 11/19/21 [id number]  ? haloperidol (HALDOL) tablet 7.5 mg  7.5 mg Oral QHS Reginaldo Hazard, Jackie Plum, MD   7.5 mg at 11/18/21 2052  ? hydrOXYzine (ATARAX) tablet 25 mg  25 mg Oral TID PRN Prescilla Sours, PA-C      ? LORazepam (ATIVAN) tablet 1 mg  1 mg Oral Q4H PRN Sonnet Rizor, Jackie Plum, MD      ? magnesium hydroxide (MILK OF MAGNESIA) suspension 30 mL  30 mL Oral Daily PRN Margorie John W, PA-C      ? mirtazapine (REMERON) tablet 7.5 mg  7.5 mg Oral QHS Jerimyah Vandunk, Jackie Plum, MD   7.5 mg at 11/18/21 2051  ? multivitamin (RENA-VIT) tablet 1 tablet  1 tablet Oral Q0200 Nicholes Rough, NP   1 tablet at 11/19/21 0736  ? Vitamin D (Ergocalciferol) (DRISDOL) capsule 50,000 Units  50,000 Units Oral Q7 days Maida Sale, MD   50,000 Units at 11/15/21 1715  ? ? ?Lab Results:  ?No results found for this or any previous visit (from the past 48 hour(s)). ? ? ?Blood Alcohol level:  ?Lab Results  ?Component Value Date  ? ETH <10 11/13/2021  ? ETH <10 06/15/2021  ? ? ?Metabolic Disorder Labs: ?Lab Results  ?Component Value Date  ? HGBA1C 5.7 (H) 11/14/2021  ? MPG 117 11/14/2021  ? ?No results found for: PROLACTIN ?Lab Results  ?Component Value Date  ? CHOL 148 11/14/2021  ? TRIG 47 11/14/2021  ? HDL 47 11/14/2021  ? CHOLHDL 3.1 11/14/2021  ? VLDL 9 11/14/2021  ? [place] 92 11/14/2021  ? ? ?Physical Findings: ?AIMS: Facial and Oral Movements ?Muscles of Facial Expression: None, normal ?Lips and Perioral Area: None, normal ?Jaw: None, normal ?Tongue: None, normal,Extremity Movements ?Upper (arms, wrists, hands, fingers): None, normal ?Lower (legs, knees, ankles, toes): None, normal, Trunk Movements ?Neck, shoulders, hips: None, normal, Overall Severity ?Severity of abnormal movements (highest score from questions above): None,  normal ?Incapacitation due to abnormal movements: None, normal ?Patient's awareness of abnormal movements (rate only patient's report): No Awareness, Dental Status ?Current problems with teeth and/or dentures?: No ?Does patient usually wear dentures?: No  ?CIWA:  CIWA-Ar Total: 0 ?COWS:    ? ?Musculoskeletal: ?Strength & Muscle Tone: within normal limits ?Gait & Station: normal ?Patient leans: N/A ? ?Psychiatric Specialty Exam: ? ?Presentation  ?General Appearance: Appropriate for Environment; Casual ? ?Eye Contact:Fair ? ?Speech:Normal Rate ? ?Speech Volume:Normal ? ?Handedness:No data recorded ? ?Mood and Affect  ?Mood:Anxious ? ?Affect:Restricted ? ? ?Thought Process  ?  Thought Processes:Goal Directed ? ?Descriptions of Associations:-- (mostly linear) ? ?Orientation:Partial ? ?Thought Content:Logical ? ?History of Schizophrenia/Schizoaffective disorder:Yes ? ?Duration of Psychotic Symptoms:Greater than six months ? ?Hallucinations:Hallucinations: None ? ?Ideas of Reference:-- (previously held delusional content) ? ?Suicidal Thoughts:Suicidal Thoughts: No ? ?Homicidal Thoughts:Homicidal Thoughts: No ? ? ?Sensorium  ?Memory:Immediate Fair; Remote Fair (distortions noted for events prior to admit) ? ?Judgment:-- (limited) ? ?Insight:Shallow ? ? ?Executive Functions  ?Concentration:Fair ? ?Attention Span:Fair ? ?Recall:-- (spotty) ? ?[place] ? ?Language:Fair ? ? ?Psychomotor Activity  ?Psychomotor Activity:Psychomotor Activity: Restlessness ? ? ?Assets  ?Assets:Physical Health ? ? ?Sleep  ?Sleep:Sleep: Fair ? ? ? ?Physical Exam: ?Physical Exam ?Vitals and nursing note reviewed.  ?Constitutional:   ?   Comments: thin  ?HENT:  ?   Head: Normocephalic.  ?Eyes:  ?   Extraocular Movements: Extraocular movements intact.  ?Pulmonary:  ?   Effort: Pulmonary effort is normal.  ?Musculoskeletal:     ?   General: Normal range of motion.  ?   Cervical back: Normal range of motion.  ?Neurological:  ?   General: No  focal deficit present.  ?   Mental Status: He is alert.  ? ?Review of Systems  ?Constitutional:  Negative for chills and fever.  ?HENT:  Negative for hearing loss.   ?Eyes:  Negative for blurred vision.  ?Respiratory:  Negative for cough.

## 2021-11-19 NOTE — Plan of Care (Signed)
?  Problem: Activity: ?Goal: Interest or engagement in activities will improve ?Outcome: Progressing ?  ?Problem: Coping: ?Goal: Ability to verbalize frustrations and anger appropriately will improve ?Outcome: Progressing ?  ?Problem: Coping: ?Goal: Ability to demonstrate self-control will improve ?Outcome: Progressing ?  ?Problem: Health Behavior/Discharge Planning: ?Goal: Compliance with treatment plan for underlying cause of condition will improve ?Outcome: Progressing ?  ?Problem: Safety: ?Goal: Periods of time without injury will increase ?Outcome: Progressing ?  ?Problem: Education: ?Goal: Knowledge of the prescribed therapeutic regimen will improve ?Outcome: Progressing ?  ?Problem: Coping: ?Goal: Coping ability will improve ?Outcome: Progressing ?  ?Problem: Role Relationship: ?Goal: Ability to communicate needs accurately will improve ?Outcome: Progressing ?  ?Problem: Safety: ?Goal: Ability to remain free from injury will improve ?Outcome: Progressing ?  ?

## 2021-11-19 NOTE — Group Note (Signed)
[facility] LCSW Group Therapy ? ? ?11/19/2021 1:00pm ? ? ? ?Type of Therapy and Topic:  Group Therapy:  Strengths Exploration ? ? ?Participation Level: Active ? ?Description of Group: ?This group allows individuals to explore their strengths, learn to use strengths in new ways to improve well-being. Strengths-based interventions involve identifying strengths, understanding how they are used, and learning new ways to apply them. Individuals will identify their strengths, and then explore their roles in different areas of life (relationships, professional life, and personal fulfillment). Individuals will think about ways in which they currently use their strengths, along with new ways they could begin using them. ? ?  ?Therapeutic Goals ?Patient will verbalize two of their strengths ?Patient will identify how their strengths are currently used ?Patient will identify two new ways to apply their strengths  ?Patients will create a plan to apply their strengths in their daily lives  ?  ? ?Summary of Patient Progress:  Patient shared that he admires Obama for his leadership. He states that his strengths are being a good listener and humor.  ? ?  ? ?Therapeutic Modalities ?Cognitive Behavioral Therapy ?Motivational Interviewing ? ? ?Ruthann Cancer MSW, LCSW ?Clincal Social Worker  ?[facility]  ?

## 2021-11-19 NOTE — Group Note (Signed)
Recreation Therapy Group Note ? ? ?Group Topic:Team Building  ?Group Date: 11/19/2021 ?Start Time: 1005 ?End Time: [id number] ?Facilitators: Victorino Sparrow, LRT,CTRS ?Location: [place] ? ? ?Goal Area(s) Addresses:  ?Patient will effectively work with peer towards shared goal.  ?Patient will identify skills used to make activity successful.  ?Patient will identify how skills used during activity can be used to reach post d/c goals.  ? ?Group Description: Straw Bridge. In teams of 3-5, patients were given 15 plastic drinking straws and an equal length of masking tape. Using the materials provided, patients were instructed to build a free standing bridge-like structure to suspend an everyday item (ex: puzzle box) off of the floor or table surface. All materials were required to be used by the team in their design. LRT facilitated post-activity discussion reviewing team process. Patients were encouraged to reflect how the skills used in this activity can be generalized to daily life post discharge.  ? ? ?Affect/Mood: Appropriate ?  ?Participation Level: Engaged ?  ?Participation Quality: Independent ?  ?Behavior: Appropriate ?  ?Speech/Thought Process: Focused ?  ?Insight: Good ?  ?Judgement: Good ?  ?Modes of Intervention: Music and Team-building ?  ?Patient Response to Interventions:  Engaged ?  ?Education Outcome: ? Acknowledges education and In group clarification offered   ? ?Clinical Observations/Individualized Feedback: Pt was bright and engaged.  Pt was also encouraging to peer, who was skeptical of the activity.  Pt was appropriate and worked well with peer during activity.  Pt was excited he and peer were able to complete the bridge.   ? ? ?Plan: Continue to engage patient in RT group sessions 2-3x/week. ? ? ?Victorino Sparrow, LRT,CTRS ?11/19/2021 1:34 PM ?

## 2021-11-19 NOTE — Progress Notes (Signed)
Ian Key was isolative to his room this evening.  He did come out of his room for snack and medications.  He denied any SI/HI or AVH.  He does not appear to be responding to internal stimuli.  He reported his day overall was 10/10 (10 the best).  He attributes this to being able to go outside today.  He rpeorted his depression and anxiety are both 2/10 (10 the worst).  He denied any pain or discomfort and appeared to be in no physical distress.  He is currently resting with his eyes closed and appears to be asleep.  Q 15 mi ? ? 11/19/21 2020  ?Psych Admission Type (Psych Patients Only)  ?Admission Status Involuntary  ?Psychosocial Assessment  ?Patient Complaints None  ?Eye Contact Fair  ?Facial Expression Fixed smile  ?Affect Appropriate to circumstance  ?Speech Logical/coherent  ?Interaction Isolative;Minimal  ?Motor Activity Other (Comment) ?(Unremarkable)  ?Appearance/Hygiene Unremarkable;In scrubs  ?Behavior Characteristics Cooperative;Appropriate to situation;Calm  ?Mood Euthymic;Pleasant  ?Thought Process  ?Coherency WDL  ?Content Blaming others  ?Delusions None reported or observed  ?Perception WDL  ?Hallucination None reported or observed  ?Judgment WDL  ?Confusion None  ?Danger to Self  ?Current suicidal ideation? Denies  ?Danger to Others  ?Danger to Others None reported or observed  ? ? ?

## 2021-11-19 NOTE — Progress Notes (Signed)
?   11/19/21 1000  ?Psych Admission Type (Psych Patients Only)  ?Admission Status Involuntary  ?Psychosocial Assessment  ?Patient Complaints None  ?Eye Contact Fair  ?Facial Expression Flat  ?Affect Flat  ?Speech Logical/coherent  ?Interaction Isolative  ?Motor Activity Other (Comment) ?(WNL)  ?Appearance/Hygiene In scrubs  ?Behavior Characteristics Cooperative;Calm  ?Mood Euthymic;Pleasant  ?Thought Process  ?Coherency WDL  ?Content Blaming others  ?Delusions None reported or observed  ?Perception WDL  ?Hallucination None reported or observed  ?Judgment WDL  ?Confusion None  ?Danger to Self  ?Current suicidal ideation? Denies  ?Danger to Others  ?Danger to Others None reported or observed  ? ? ?

## 2021-11-20 NOTE — Progress Notes (Signed)
[facility] MD Progress Note ? ?11/20/2021 1:33 PM ?Ian Key  ?MRN:  [id number] ?Patient is a 31 year old male who was admitted under IVC for making statements of wanting to be dead.  Patient has a history of medication noncompliance, being aggressive towards siblings, not sleeping, responding to internal stimuli. ?Subjective:  ?Patient this morning reports that he is doing fairly well, understands that he needs to take his medications as prescribed and feels that he is in better control of his emotions.  Patient also states that his relationship with mom has improved, that they have talked and that he plans to stay on his psychotropic medication.  He reports that he follows up at [facility] will be and plans to keep those follow-up appointments. ? ?Patient denies any side effects to his medications, reports he slept well last night, denies any hallucinations, any delusions, paranoia and reports that he feels he is close to discharge ?Principal Problem: Schizoaffective disorder, bipolar type (HCC) ?Diagnosis: Principal Problem: ?  Schizoaffective disorder, bipolar type (HCC) ?Active Problems: ?  Marijuana use ?  Housing problems ?  Anxiety ?  Tobacco use disorder ?  Vitamin D deficiency ? ?Total Time spent with patient: 20 minutes ? ?Past Psychiatric History: Unchanged from previous note.  Patient is diagnosed with schizoaffective disorder, bipolar type, has been hospitalized previously at [facility], has been to [facility] long and has had 3 hospitalizations when he was in [place] a few years ago ? ?Past Medical History: History reviewed. No pertinent past medical history. History reviewed. No pertinent surgical history. ?Family History: History reviewed. No pertinent family history. ?Family Psychiatric  History: None reported ?Social History:  ?Social History  ? ?Substance and Sexual Activity  ?Alcohol Use Yes  ?   ?Social History  ? ?Substance and Sexual Activity  ?Drug Use Yes  ? Types: Marijuana  ?  ?Social History   ? ?Socioeconomic History  ? Marital status: Single  ?  Spouse name: Not on file  ? Number of children: Not on file  ? Years of education: Not on file  ? Highest education level: Not on file  ?Occupational History  ? Not on file  ?Tobacco Use  ? Smoking status: Some Days  ?  Packs/day: 0.25  ?  Years: 12.00  ?  Pack years: 3.00  ?  Types: Cigarettes  ? Smokeless tobacco: Never  ?Vaping Use  ? Vaping Use: Never used  ?Substance and Sexual Activity  ? Alcohol use: Yes  ? Drug use: Yes  ?  Types: Marijuana  ? Sexual activity: Yes  ?  Birth control/protection: Condom  ?Other Topics Concern  ? Not on file  ?Social History Narrative  ? Not on file  ? ?Social Determinants of Health  ? ?Financial Resource Strain: Not on file  ?Food Insecurity: Not on file  ?Transportation Needs: Not on file  ?Physical Activity: Not on file  ?Stress: Not on file  ?Social Connections: Not on file  ? ?Additional Social History:  ?  ?  ?  ?  ?  ?  ?  ?  ?  ?  ?  ? ?Sleep: Fair ? ?Appetite:  Fair ? ?Current Medications: ?Current Facility-Administered Medications  ?Medication Dose Route Frequency Provider Last Rate Last Admin  ? acetaminophen (TYLENOL) tablet 650 mg  650 mg Oral Q6H PRN Jaclyn Shaggy, PA-C      ? alum & mag hydroxide-simeth (MAALOX/MYLANTA) 200-200-20 MG/5ML suspension 30 mL  30 mL Oral Q4H PRN Jaclyn Shaggy, PA-C      ?  haloperidol (HALDOL) tablet 2 mg  2 mg Oral QAC breakfast Hill, Shelbie HutchingStephanie Leigh, MD   2 mg at 11/20/21 [id number]  ? haloperidol (HALDOL) tablet 7.5 mg  7.5 mg Oral QHS Hill, Shelbie HutchingStephanie Leigh, MD   7.5 mg at 11/19/21 2050  ? hydrOXYzine (ATARAX) tablet 25 mg  25 mg Oral TID PRN Jaclyn Shaggyaylor, Cody W, PA-C      ? LORazepam (ATIVAN) tablet 1 mg  1 mg Oral Q4H PRN Hill, Shelbie HutchingStephanie Leigh, MD      ? magnesium hydroxide (MILK OF MAGNESIA) suspension 30 mL  30 mL Oral Daily PRN Jaclyn Shaggyaylor, Cody W, PA-C      ? mirtazapine (REMERON) tablet 15 mg  15 mg Oral QHS Hill, Shelbie HutchingStephanie Leigh, MD   15 mg at 11/19/21 2049  ? multivitamin (RENA-VIT)  tablet 1 tablet  1 tablet Oral Q0200 Starleen BlueNkwenti, Doris, NP   1 tablet at 11/20/21 0749  ? Vitamin D (Ergocalciferol) (DRISDOL) capsule 50,000 Units  50,000 Units Oral Q7 days Roselle LocusHill, Stephanie Leigh, MD   50,000 Units at 11/15/21 1715  ? ? ?Lab Results: No results found for this or any previous visit (from the past 48 hour(s)). ? ?Blood Alcohol level:  ?Lab Results  ?Component Value Date  ? ETH <10 11/13/2021  ? ETH <10 06/15/2021  ? ? ?Metabolic Disorder Labs: ?Lab Results  ?Component Value Date  ? HGBA1C 5.7 (H) 11/14/2021  ? MPG 117 11/14/2021  ? ?No results found for: PROLACTIN ?Lab Results  ?Component Value Date  ? CHOL 148 11/14/2021  ? TRIG 47 11/14/2021  ? HDL 47 11/14/2021  ? CHOLHDL 3.1 11/14/2021  ? VLDL 9 11/14/2021  ? LDLCALC 92 11/14/2021  ? ? ?Physical Findings: ?AIMS: Facial and Oral Movements ?Muscles of Facial Expression: None, normal ?Lips and Perioral Area: None, normal ?Jaw: None, normal ?Tongue: None, normal,Extremity Movements ?Upper (arms, wrists, hands, fingers): None, normal ?Lower (legs, knees, ankles, toes): None, normal, Trunk Movements ?Neck, shoulders, hips: None, normal, Overall Severity ?Severity of abnormal movements (highest score from questions above): None, normal ?Incapacitation due to abnormal movements: None, normal ?Patient's awareness of abnormal movements (rate only patient's report): No Awareness, Dental Status ?Current problems with teeth and/or dentures?: No ?Does patient usually wear dentures?: No  ?CIWA:  CIWA-Ar Total: 0 ?COWS:    ? ?Musculoskeletal: ?Strength & Muscle Tone: within normal limits ?Gait & Station: normal ?Patient leans: N/A ? ?Psychiatric Specialty Exam: ? ?Presentation  ?General Appearance: Appropriate for Environment; casually dressed ? ?Eye Contact:Fair ? ?Speech:Clear and Coherent; Normal Rate ? ?Speech Volume: Normal in volume ? ?Handedness:Right ? ? ?Mood and Affect  ?Mood:Dysphoric ? ?Affect:Constricted ? ? ?Thought Process  ?Thought  Processes:Coherent; Linear ? ?Descriptions of Associations:Intact ? ?Orientation:Full (Time, Place and Person) ? ?Thought Content:Logical; Rumination ? ?History of Schizophrenia/Schizoaffective disorder:Yes ? ?Duration of Psychotic Symptoms:Greater than six months ? ?Hallucinations:Hallucinations: None ? ?Ideas of Reference:None ? ?Suicidal Thoughts:Suicidal Thoughts: No ? ?Homicidal Thoughts:Homicidal Thoughts: No ? ? ?Sensorium  ?Memory:Immediate Fair; Recent Fair; Remote Fair ? ?Judgment:Impaired ? ?Insight:Poor ? ? ?Executive Functions  ?Concentration:Fair ? ?Attention Span:Fair ? ?Recall:Fair ? ?Fund of Knowledge:Fair ? ?Language:Fair ? ? ?Psychomotor Activity  ?Psychomotor Activity:Psychomotor Activity: Mannerisms ? ? ?Assets  ?Assets:Desire for Improvement; Communication Skills; Housing; Social Support ? ? ?Sleep  ?Sleep:Sleep: Fair ? ? ? ?Physical Exam: ?Physical Exam ?Review of Systems  ?Constitutional: Negative.   ?HENT: Negative.    ?Eyes: Negative.   ?Respiratory: Negative.    ?Cardiovascular: Negative.   ?Gastrointestinal: Negative.   ?Musculoskeletal:  Negative.   ?Skin: Negative.   ?Neurological: Negative.   ?Psychiatric/Behavioral:  Negative for depression, hallucinations, memory loss, substance abuse and suicidal ideas. The patient is not nervous/anxious and does not have insomnia.   ?Blood pressure 113/64, pulse 85, temperature 97.8 ?F (36.6 ?C), temperature source Oral, resp. rate 16, height 5\' 10"  (1.778 m), weight 63.7 kg, SpO2 99 %. Body mass index is 20.15 kg/m?. ? ? ?Treatment Plan Summary: ?Daily contact with patient to assess and evaluate symptoms and progress in treatment and Medication management ? ?Patient to participate in therapeutic milieu, work on discharge planning for tomorrow. ?Psychosis: To continue Haldol 2 mg before breakfast and 7.5 mg daily at bedtime for psychosis. ? ?To continue Remeron 15 mg at bedtime for mood, anxiety and sleep ?Substance use education done, patient would  benefit from following up at the [facility] for chemical dependency program ? ?Discussed discharge planning with patient, patient is currently on every 15 minute checks.  Patient is also today working on his discharge goals, for

## 2021-11-20 NOTE — Progress Notes (Signed)
Ian Key was out in the dayroom more this evening.  He denied SI/HI or AVH.  He rated his day 10/10.  He reported his depression is 2/10 and anxiety 1/10 (10 the worst).  He stated that he is ready to be discharged tomorrow but is worried about how things will go when he leaves.  Maalox given for heart burn with good relief.  Q 15 minute checks maintained for safety. ? ? 11/20/21 2041  ?Psych Admission Type (Psych Patients Only)  ?Admission Status Involuntary  ?Psychosocial Assessment  ?Patient Complaints Anxiety  ?Eye Contact Fair  ?Facial Expression Animated  ?Affect Appropriate to circumstance  ?Speech Logical/coherent  ?Interaction Assertive  ?Motor Activity Other (Comment) ?(Unremarkable)  ?Appearance/Hygiene Unremarkable  ?Behavior Characteristics Appropriate to situation;Cooperative  ?Thought Process  ?Coherency WDL  ?Content WDL  ?Delusions None reported or observed  ?Perception WDL  ?Hallucination None reported or observed  ?Judgment WDL  ?Confusion None  ?Danger to Self  ?Current suicidal ideation? Denies  ?Danger to Others  ?Danger to Others None reported or observed  ? ? ?

## 2021-11-20 NOTE — Progress Notes (Signed)
Adult Psychoeducational Group Note ? ?Date:  11/20/2021 ?Time:  8:41 PM ? ?Group Topic/Focus:  ?Wrap-Up Group:   The focus of this group is to help patients review their daily goal of treatment and discuss progress on daily workbooks. ? ?Participation Level:  Active ? ?Participation Quality:  Appropriate ? ?Affect:  Appropriate ? ?Cognitive:  Appropriate ? ?Insight: Appropriate ? ?Engagement in Group:  Engaged ? ?Modes of Intervention:  Discussion ? ?Additional Comments:  Pt stated his goal for today was to focus on his treatment plan and to talk with his doctor about his discharge plan. Pt stated he accomplished his goals today. Pt stated he talked with his doctor and social worker about his care today. Pt rated his overall day a 10. Pt stated the plan is for him to discharge on 11/21/21. Pt stated he was able to contact his mother today which improved his over all day. Pt stated he felt better about himself today. Pt stated he was able to attend all meals. Pt stated he took all medications provided today. Pt stated he attend all groups held today. Pt stated his appetite was pretty good today. Pt rated sleep last night was pretty good. Pt stated the goal tonight was to get some rest. Pt stated he had some physical pain tonight. Pt stated he had some severe pain in his left foot. Pt stated the severe pain in his left foot was a 10 on the pain level scale. Pt nurse was updated on the situation. Pt deny visual hallucinations and auditory issues tonight. Pt denies thoughts of harming himself or others. Pt stated he would alert staff if anything changed ? ?Felipa Furnace ?11/20/2021, 8:41 PM ?

## 2021-11-20 NOTE — Plan of Care (Signed)
?  Problem: Activity: ?Goal: Interest or engagement in activities will improve ?Outcome: Progressing ?  ?Problem: Coping: ?Goal: Ability to verbalize frustrations and anger appropriately will improve ?Outcome: Progressing ?  ?Problem: Coping: ?Goal: Ability to demonstrate self-control will improve ?Outcome: Progressing ?  ?Problem: Health Behavior/Discharge Planning: ?Goal: Compliance with treatment plan for underlying cause of condition will improve ?Outcome: Progressing ?  ?Problem: Safety: ?Goal: Periods of time without injury will increase ?Outcome: Progressing ?  ?Problem: Education: ?Goal: Will be free of psychotic symptoms ?Outcome: Progressing ?  ?Problem: Coping: ?Goal: Coping ability will improve ?Outcome: Progressing ?  ?Problem: Health Behavior/Discharge Planning: ?Goal: Compliance with prescribed medication regimen will improve ?Outcome: Progressing ?  ?

## 2021-11-20 NOTE — Group Note (Signed)
Recreation Therapy Group Note ? ? ?Group Topic:Healthy Decision Making  ?Group Date: 11/20/2021 ?Start Time: 1000 ?End Time: 1035 ?Facilitators: Caroll Rancher, LRT,CTRS ?Location: [address] Dayroom ? ? ?Goal Area(s) Addresses:  ?Patient will effectively work with peer towards shared goal.  ?Patient will identify factors that guided their decision making.  ?Patient will pro-socially communicate ideas during group session.  ? ?Group Description:   Patients were given a scenario that they were going to be stranded on a deserted [place] for several months before being rescued. Writer tasked them with making a list of 15 things they would choose to bring with them for "survival". The list of items was prioritized most important to least. Each patient would come up with their own list, then work together to create a new list of 15 items while in a group of 3-5 peers. LRT discussed each person's list and how it differed from others. The debrief included discussion of priorities, good decisions versus bad decisions, and how it is important to think before acting so we can make the best decision possible. LRT tied the concept of effective communication among group members to patient's support systems outside of the hospital and its benefit post discharge. ? ? ?Affect/Mood: Appropriate ?  ?Participation Level: Engaged ?  ?Participation Quality: Independent ?  ?Behavior: Appropriate ?  ?Speech/Thought Process: Focused ?  ?Insight: Good ?  ?Judgement: Good ?  ?Modes of Intervention: Problem-solving ?  ?Patient Response to Interventions:  Engaged ?  ?Education Outcome: ? Acknowledges education and In group clarification offered   ? ?Clinical Observations/Individualized Feedback: Pt was bright and engaged.  Pt expressed people tend to make positive decisions.  For his survival list, pt identified some things such as osmosis pump, medical supplies, matches, rope/wire, crank radio, tools, map and snacks that he would take with him  to the [place].  When asked about a situation he found himself in were he didn't know what to do, pt discussed a situation at work.  Pt stated one of his coworkers stopped working and he didn't know how to handle the situation.  When asked what he could have done in that situation, pt expressed he could have told his supervisor and let them handle it.  Pt was attentive and was the leader when group had to construct one list out of their individual lists.  ? ? ?Plan: Continue to engage patient in RT group sessions 2-3x/week. ? ? ?Caroll Rancher, LRT,CTRS ?11/20/2021 12:17 PM ?

## 2021-11-20 NOTE — Progress Notes (Signed)
?   11/20/21 0932  ?Psych Admission Type (Psych Patients Only)  ?Admission Status Involuntary  ?Psychosocial Assessment  ?Patient Complaints None  ?Eye Contact Fair  ?Facial Expression Animated  ?Affect Appropriate to circumstance  ?Speech Logical/coherent  ?Interaction Minimal  ?Motor Activity Other (Comment) ?(WNL)  ?Appearance/Hygiene Unremarkable  ?Behavior Characteristics Appropriate to situation;Cooperative  ?Mood Euthymic;Pleasant  ?Thought Process  ?Coherency WDL  ?Content WDL  ?Delusions None reported or observed  ?Perception WDL  ?Hallucination None reported or observed  ?Judgment WDL  ?Confusion None  ?Danger to Self  ?Current suicidal ideation? Denies  ?Danger to Others  ?Danger to Others None reported or observed  ? ? ?

## 2021-11-20 NOTE — BHH Group Notes (Signed)
Adult Psychoeducational Group Note ? ?Date:  11/20/2021 ?Time:  10:41 AM ? ?Group Topic/Focus:  ?Orientation:   The focus of this group is to educate the patient on the purpose and policies of crisis stabilization and provide a format to answer questions about their admission.  The group details unit policies and expectations of patients while admitted. ? ?Participation Level:  Did Not Attend ? ? ?Additional Comments:  Pt did not attend Orientation group ? ?Thomas Hoff ?11/20/2021, 10:41 AM ?

## 2021-11-21 ENCOUNTER — Telehealth (HOSPITAL_COMMUNITY): Payer: Self-pay | Admitting: Psychiatry

## 2021-11-21 DIAGNOSIS — F250 Schizoaffective disorder, bipolar type: Principal | ICD-10-CM

## 2021-11-21 MED ORDER — VITAMIN D (ERGOCALCIFEROL) 1.25 MG (50000 UNIT) PO CAPS: 50000.0000 [IU] | ORAL_CAPSULE | ORAL | 0 refills | Status: DC

## 2021-11-21 MED ORDER — HALOPERIDOL 0.5 MG PO TABS: 7.5000 mg | ORAL_TABLET | Freq: Every day | ORAL | 0 refills | Status: DC

## 2021-11-21 MED ORDER — MIRTAZAPINE 15 MG PO TABS: 15.0000 mg | ORAL_TABLET | Freq: Every day | ORAL | 0 refills | Status: DC

## 2021-11-21 MED ORDER — HALOPERIDOL 2 MG PO TABS: 2.0000 mg | ORAL_TABLET | Freq: Every day | ORAL | 0 refills | Status: DC
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
## 2021-11-21 NOTE — [facility] Group Notes (Signed)
The focus of this group is to help patients establish daily goals to achieve during treatment and discuss how the patient can incorporate goal setting into their daily lives to aide in recovery.  Pt did not attend group 
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
## 2021-11-21 NOTE — Progress Notes (Signed)
Recreation Therapy Notes ? ?INPATIENT RECREATION TR PLAN ? ?Patient Details ?Name: Ian Key ?MRN: [id number] ?DOB: 06-02-1991 ?Today's Date: 11/21/2021 ? ?Rec Therapy Plan ?Is patient appropriate for Therapeutic Recreation?: Yes ?Treatment times per week: about 3 days ?Estimated Length of Stay: 5-7 days ?TR Treatment/Interventions: Group participation (Comment) ? ?Discharge Criteria ?Pt will be discharged from therapy if:: Discharged ?Treatment plan/goals/alternatives discussed and agreed upon by:: Patient/family ? ?Discharge Summary ?Short term goals set: See patient care plan ?Short term goals met: Adequate for discharge ?Progress toward goals comments: Groups attended ?Which groups?: Wellness, Leisure education, Communication, Other (Comment) (Team Building; Decision Making) ?Reason goals not met: None ?Therapeutic equipment acquired: N/A ?Reason patient discharged from therapy: Discharge from hospital ?Pt/family agrees with progress & goals achieved: Yes ?Date patient discharged from therapy: 11/21/21 ? ? ?Victorino Sparrow, LRT,CTRS ?Victorino Sparrow A ?11/21/2021, 12:43 PM ?

## 2021-11-21 NOTE — Telephone Encounter (Signed)
Called twice to r/s this appt. ?No answer - busy line dial tone - called both numbers on file ? ?[phone]    [phone] ? ? ? ?Dr. De Nurse out of office on 11/30/21 ?

## 2021-11-21 NOTE — Progress Notes (Signed)
?  [facility] Adult Case Management Discharge Plan : ? ?Will you be returning to the same living situation after discharge:  Yes,  home with mother ?At discharge, do you have transportation home?: Yes,  mother to pick this patient up ?Do you have the ability to pay for your medications: Yes,  has insurance ? ?Release of information consent forms completed and in the chart;  Patient's signature needed at discharge. ? ?Patient to Follow up at: ? Follow-up Information   ? ? [facility] Follow up on 11/30/2021.   ?Specialty: Behavioral Health ?Why: You have an appointment for medication management services on  11/30/21 at 9:00 am.  You also have an appointment for therapy services on 12/12/21 at 9:00 am.   These will be Virtual appointments.  A link will be sent to you via text to # [phone].  * PLEASE CANCEL IF APPTS NOT NEEDED. ?Contact information: ?[address] ?[place] ?[phone] ? ?  ?  ? ? [facility] Go on 11/23/2021.   ?Why: You have a hospital follow up appointment for therapy and medication management services on 11/23/21 at 3:00 pm.  * Please call to confirm.   (Provider requested a discharge summary prior to discharge.  However, we cannot provide this document until after discharge, hence the name) ?Contact information: ?[address] ?[place] [address] ?[phone] ? ? ?  ?  ? ?  ?  ? ?  ? ? ?Next level of care provider has access to [facility] ? ?Safety Planning and Suicide Prevention discussed: Yes,  with mother ? ?  ? ?Has patient been referred to the Quitline?: Patient refused referral ? ?Patient has been referred for addiction treatment: Pt. refused referral ? ?Vassie Moselle, LCSW ?11/21/2021, 10:11 AM ?

## 2021-11-21 NOTE — Progress Notes (Signed)
Discharge note: Survey complete. RN met with pt and reviewed pt's discharge instructions. Pt verbalized understanding of discharge instructions and pt did not have any questions. RN reviewed and provided pt with a copy of SRA, AVS and Transition Record. RN returned pt's belongings to pt.  Prescriptions and samples were given to pt. Pt denied SI/HI/AVH and voiced no concerns. Pt was appreciative of the care pt received at [facility]. Patient discharged to the lobby without incident.  ? 11/21/21 0742  ?Psych Admission Type (Psych Patients Only)  ?Admission Status Involuntary  ?Psychosocial Assessment  ?Patient Complaints Anxiety;Depression  ?Eye Contact Fair  ?Facial Expression Animated  ?Affect Appropriate to circumstance  ?Speech Logical/coherent  ?Interaction Assertive  ?Motor Activity Other (Comment) ?(WDL)  ?Appearance/Hygiene Unremarkable  ?Behavior Characteristics Cooperative;Appropriate to situation;Calm  ?Mood Pleasant  ?Thought Process  ?Coherency WDL  ?Content WDL  ?Delusions None reported or observed  ?Perception WDL  ?Hallucination None reported or observed  ?Judgment WDL  ?Confusion None  ?Danger to Self  ?Current suicidal ideation? Denies  ?Danger to Others  ?Danger to Others None reported or observed  ? ? ?

## 2021-11-21 NOTE — Group Note (Signed)
Recreation Therapy Group Note ? ? ?Group Topic:Health and Wellness  ?Group Date: 11/21/2021 ?Start Time: 1005 ?End Time: [id number] ?Facilitators: Victorino Sparrow, LRT,CTRS ?Location: [facility] ? ? ?Goal Area(s) Addresses:  ?Patient will define components of whole wellness. ?Patient will verbalize benefit of whole wellness. ? ?Group Description:  Music Therapy.  Music plays and important role in peoples' lives.  Music can illicit various emotions in people that can bring positive memories or motivate.  LRT took requests from patients to play songs that have a special meaning to them.  Each patient would then explain the importance of the song they chose. ? ? ?Affect/Mood: Appropriate ?  ?Participation Level: Engaged ?  ?Participation Quality: Independent ?  ?Behavior: Appropriate ?  ?Speech/Thought Process: Focused ?  ?Insight: Good ?  ?Judgement: Good ?  ?Modes of Intervention: Music ?  ?Patient Response to Interventions:  Engaged ?  ?Education Outcome: ? Acknowledges education and In group clarification offered   ? ?Clinical Observations/Individualized Feedback: Pt was bright and engaged when prompted.  Pt was excited to be getting discharged.  Pt requested a song by [email or web address] called Make It Better.  Pt stated he liked the song because he can listen to it with other people that would like it as well.  Pt also seemed intrigued by some of the songs picked by his peers.  Pt was pleasant during group session.  ?  ? ?Plan: Continue to engage patient in RT group sessions 2-3x/week. ? ? ?Victorino Sparrow, LRT,CTRS ?11/21/2021 11:47 AM ?

## 2021-11-21 NOTE — Plan of Care (Signed)
Patient was able to identify some coping skills at completion of recreation therapy group sessions. ? ? ?Victorino Sparrow, LRT,CTRS ?

## 2021-11-21 NOTE — Progress Notes (Signed)
Patient discharged home with mother with samples of medications and prescriptions. Discharged with all belongings.  ?

## 2021-11-30 ENCOUNTER — Telehealth (HOSPITAL_COMMUNITY): Admitting: Psychiatry

## 2021-12-05 ENCOUNTER — Telehealth (HOSPITAL_COMMUNITY): Admitting: Psychiatry

## 2021-12-12 ENCOUNTER — Ambulatory Visit (HOSPITAL_COMMUNITY): Admitting: Licensed Clinical Social Worker

## 2022-05-16 ENCOUNTER — Ambulatory Visit (HOSPITAL_COMMUNITY)
Admission: EM | Admit: 2022-05-16 | Discharge: 2022-05-17 | Disposition: A | Discharge status: Home / Self Care | Attending: Behavioral Health | Admitting: Behavioral Health

## 2022-05-16 DIAGNOSIS — Z91148 Patient's other noncompliance with medication regimen for other reason: Secondary | ICD-10-CM | POA: Diagnosis not present

## 2022-05-16 DIAGNOSIS — Z1152 Encounter for screening for COVID-19: Secondary | ICD-10-CM | POA: Insufficient documentation

## 2022-05-16 DIAGNOSIS — F250 Schizoaffective disorder, bipolar type: Secondary | ICD-10-CM | POA: Diagnosis not present

## 2022-05-16 DIAGNOSIS — F1290 Cannabis use, unspecified, uncomplicated: Secondary | ICD-10-CM | POA: Insufficient documentation

## 2022-05-16 DIAGNOSIS — Z79899 Other long term (current) drug therapy: Secondary | ICD-10-CM | POA: Diagnosis not present

## 2022-05-16 LAB — RESP PANEL BY RT-PCR (FLU A&B, COVID) ARPGX2
Influenza A by PCR: NEGATIVE
Influenza B by PCR: NEGATIVE
SARS Coronavirus 2 by RT PCR: NEGATIVE

## 2022-05-16 LAB — CBC WITH DIFFERENTIAL/PLATELET
Abs Immature Granulocytes: 0.02 10*3/uL (ref 0.00–0.07)
Basophils Absolute: 0 10*3/uL (ref 0.0–0.1)
Basophils Relative: 0 %
Eosinophils Absolute: 0 10*3/uL (ref 0.0–0.5)
Eosinophils Relative: 1 %
HCT: 43.5 % (ref 39.0–52.0)
Hemoglobin: 15.1 g/dL (ref 13.0–17.0)
Immature Granulocytes: 0 %
Lymphocytes Relative: 13 %
Lymphs Abs: 1 10*3/uL (ref 0.7–4.0)
MCH: 29.3 pg (ref 26.0–34.0)
MCHC: 34.7 g/dL (ref 30.0–36.0)
MCV: 84.3 fL (ref 80.0–100.0)
Monocytes Absolute: 0.2 10*3/uL (ref 0.1–1.0)
Monocytes Relative: 3 %
Neutro Abs: 6.2 10*3/uL (ref 1.7–7.7)
Neutrophils Relative %: 83 %
Platelets: 255 10*3/uL (ref 150–400)
RBC: 5.16 MIL/uL (ref 4.22–5.81)
RDW: 12.7 % (ref 11.5–15.5)
WBC: 7.5 10*3/uL (ref 4.0–10.5)
nRBC: 0 % (ref 0.0–0.2)

## 2022-05-16 LAB — COMPREHENSIVE METABOLIC PANEL
ALT: 39 U/L (ref 0–44)
AST: 24 U/L (ref 15–41)
Albumin: 4.6 g/dL (ref 3.5–5.0)
Alkaline Phosphatase: 41 U/L (ref 38–126)
Anion gap: 8 (ref 5–15)
BUN: 7 mg/dL (ref 6–20)
CO2: 24 mmol/L (ref 22–32)
Calcium: 9.6 mg/dL (ref 8.9–10.3)
Chloride: 107 mmol/L (ref 98–111)
Creatinine, Ser: 0.99 mg/dL (ref 0.61–1.24)
GFR, Estimated: >60 mL/min (ref >60)
Glucose, Bld: 114 mg/dL — ABNORMAL HIGH (ref 70–99)
Potassium: 3.8 mmol/L (ref 3.5–5.1)
Sodium: 139 mmol/L (ref 135–145)
Total Bilirubin: 0.6 mg/dL (ref 0.3–1.2)
Total Protein: 7.2 g/dL (ref 6.5–8.1)

## 2022-05-16 LAB — POCT URINE DRUG SCREEN - MANUAL ENTRY (I-SCREEN)
POC Amphetamine UR: NOT DETECTED
POC Buprenorphine (BUP): NOT DETECTED
POC Cocaine UR: NOT DETECTED
POC Marijuana UR: POSITIVE — AB
POC Methadone UR: NOT DETECTED
POC Methamphetamine UR: NOT DETECTED
POC Morphine: NOT DETECTED
POC Oxazepam (BZO): NOT DETECTED
POC Oxycodone UR: NOT DETECTED
POC Secobarbital (BAR): NOT DETECTED

## 2022-05-16 LAB — ETHANOL: Alcohol, Ethyl (B): <10 mg/dL (ref <10)

## 2022-05-16 MED ORDER — HYDROXYZINE HCL 25 MG PO TABS
25.0000 mg | ORAL_TABLET | Freq: Three times a day (TID) | ORAL | Status: DC | PRN
  Administered 2022-05-16: 25 mg via ORAL
  Filled 2022-05-16: qty 1

## 2022-05-16 MED ORDER — MAGNESIUM HYDROXIDE 400 MG/5ML PO SUSP: 30.0000 mL | Freq: Every day | ORAL | Status: DC | PRN

## 2022-05-16 MED ORDER — HALOPERIDOL 5 MG PO TABS
5.0000 mg | ORAL_TABLET | Freq: Every day | ORAL | Status: DC
  Administered 2022-05-16: 5 mg via ORAL
  Filled 2022-05-16: qty 1

## 2022-05-16 MED ORDER — MIRTAZAPINE 15 MG PO TABS
15.0000 mg | ORAL_TABLET | Freq: Every day | ORAL | Status: DC
  Administered 2022-05-16: 15 mg via ORAL
  Filled 2022-05-16: qty 1

## 2022-05-16 MED ORDER — ACETAMINOPHEN 325 MG PO TABS: 650.0000 mg | ORAL_TABLET | Freq: Four times a day (QID) | ORAL | Status: DC | PRN

## 2022-05-16 MED ORDER — ALUM & MAG HYDROXIDE-SIMETH 200-200-20 MG/5ML PO SUSP: 30.0000 mL | ORAL | Status: DC | PRN

## 2022-05-16 NOTE — ED Notes (Signed)
Pt A&O x 4, no distress noted, watching TV at present. Calm & cooperative.  Monitoring for safety. 

## 2022-05-16 NOTE — ED Notes (Signed)
Pt sleeping at present, no distress noted.  Monitoring for safety. 
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
## 2022-05-16 NOTE — Discharge Instructions (Addendum)
Discharge recommendations:  Patient is to take medications as prescribed. Patient provided with a 14 day prescription for oral Mirtazapine and Haldol. Please talk to your outpatient provider about starting a long acting injectable for medication compliance.   Please follow up with the [facility] in [place] for medication management.   Please follow up with your primary care provider for all medical related needs.   Therapy: We recommend that patient participate in individual therapy to address mental health concerns.  Medications: The patient is to contact a medical professional and/or outpatient provider to address any new side effects that develop. The patient should update outpatient providers of any new medications and/or medication changes.   Atypical antipsychotics: If you are prescribed an atypical antipsychotic, it is recommended that your height, weight, BMI, blood pressure, fasting lipid panel, and fasting blood sugar be monitored by your outpatient providers.  Safety:  The patient should abstain from use of illicit substances/drugs and abuse of any medications. If symptoms worsen or do not continue to improve or if the patient becomes actively suicidal or homicidal then it is recommended that the patient return to the closest hospital emergency department, the [facility], or call 911 for further evaluation and treatment. National Suicide Prevention Lifeline 1-800-SUICIDE or (636)322-3976.  About 988 988 offers 24/7 access to trained crisis counselors who can help people experiencing mental health-related distress. People can call or text 988 or chat 988lifeline.org for themselves or if they are worried about a loved one who may need crisis support.   Conservation officer, nature (CST) services provide intensive community-based mental health and/or substance use rehabilitation services for adults with severe mental health or addiction needs. CST use a "team"  approach to assist adults in achieving their recovery goals. The members of our CST team act as advocates, monitors and service coordinators.   CST can:  Help individual service users and their caregivers understand a person's mental illness or addiction and how to treat it  Work with service providers to document and improve a person's medication management skills  Offer intensive case management - coordination of services, support systems, crisis planning and prevention; monitoring & follow-up  Link individuals with needed community resources: recovery programs, mental health/substance use assessments and evaluations, etc.  Help people find housing, employment or build job skills  Work with individuals to help them become better integrated in their communities through improved interpersonal and independent living skills  Patient may qualify for CST services please call the following agencies and inquire about their CST program.   [employer]  [address] [place]  [facility] [place]
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
## 2022-05-17 MED ORDER — HALOPERIDOL 5 MG PO TABS: 5.0000 mg | ORAL_TABLET | Freq: Every day | ORAL | 0 refills | Status: DC

## 2022-05-17 MED ORDER — MIRTAZAPINE 15 MG PO TABS: 15.0000 mg | ORAL_TABLET | Freq: Every day | ORAL | 0 refills | Status: DC

## 2022-05-17 NOTE — ED Notes (Signed)
Pt denies SI/HI/AVH. Pt currently resting on pull out bed/chair. Easily awakened to converse with. No scheduled meds admin this morning. Safety maintained and will continue to monitor.

## 2022-05-17 NOTE — ED Notes (Signed)
Pt sleeping at present, no distress noted.  Monitoring for safety. 

## 2022-05-17 NOTE — ED Notes (Signed)
Discharge instructions provided and Pt stated understanding. Informed pt that he has a f/u appointment with the [facility] on 05/22/22. Pt stated he was aware of the appointment. Pt alert, orient and ambulatory prior to d/c from facility. Personal belongings returned from locker number 56. Escorted pt to front lobby to d/c from facility with his mom. Safety maintained.
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
## 2023-06-21 ENCOUNTER — Encounter (HOSPITAL_COMMUNITY): Payer: Self-pay | Admitting: Emergency Medicine

## 2023-06-21 ENCOUNTER — Other Ambulatory Visit: Payer: Self-pay

## 2023-06-21 ENCOUNTER — Emergency Department (HOSPITAL_COMMUNITY)
Admission: EM | Admit: 2023-06-21 | Discharge: 2023-06-23 | Disposition: A | Discharge status: Other Acute Inpatient Hospital | Attending: Emergency Medicine | Admitting: Emergency Medicine

## 2023-06-21 DIAGNOSIS — F250 Schizoaffective disorder, bipolar type: Secondary | ICD-10-CM | POA: Insufficient documentation

## 2023-06-21 DIAGNOSIS — R443 Hallucinations, unspecified: Secondary | ICD-10-CM

## 2023-06-21 LAB — CBC
HCT: 48.3 % (ref 39.0–52.0)
Hemoglobin: 16.4 g/dL (ref 13.0–17.0)
MCH: 29 pg (ref 26.0–34.0)
MCHC: 34 g/dL (ref 30.0–36.0)
MCV: 85.3 fL (ref 80.0–100.0)
Platelets: 235 10*3/uL (ref 150–400)
RBC: 5.66 MIL/uL (ref 4.22–5.81)
RDW: 12.4 % (ref 11.5–15.5)
WBC: 7.2 10*3/uL (ref 4.0–10.5)
nRBC: 0 % (ref 0.0–0.2)

## 2023-06-21 LAB — COMPREHENSIVE METABOLIC PANEL
ALT: 24 U/L (ref 0–44)
AST: 20 U/L (ref 15–41)
Albumin: 5 g/dL (ref 3.5–5.0)
Alkaline Phosphatase: 43 U/L (ref 38–126)
Anion gap: 8 (ref 5–15)
BUN: 11 mg/dL (ref 6–20)
CO2: 25 mmol/L (ref 22–32)
Calcium: 9.1 mg/dL (ref 8.9–10.3)
Chloride: 105 mmol/L (ref 98–111)
Creatinine, Ser: 0.93 mg/dL (ref 0.61–1.24)
GFR, Estimated: >60 mL/min (ref >60)
Glucose, Bld: 93 mg/dL (ref 70–99)
Potassium: 3.8 mmol/L (ref 3.5–5.1)
Sodium: 138 mmol/L (ref 135–145)
Total Bilirubin: 1.3 mg/dL — ABNORMAL HIGH (ref <1.2)
Total Protein: 8.2 g/dL — ABNORMAL HIGH (ref 6.5–8.1)

## 2023-06-21 LAB — RAPID URINE DRUG SCREEN, HOSP PERFORMED
Amphetamines: NOT DETECTED
Barbiturates: NOT DETECTED
Benzodiazepines: NOT DETECTED
Cocaine: NOT DETECTED
Opiates: NOT DETECTED
Tetrahydrocannabinol: POSITIVE — AB

## 2023-06-21 LAB — ETHANOL: Alcohol, Ethyl (B): <10 mg/dL (ref <10)

## 2023-06-21 LAB — SALICYLATE LEVEL: Salicylate Lvl: <7 mg/dL — ABNORMAL LOW (ref 7.0–30.0)

## 2023-06-21 LAB — ACETAMINOPHEN LEVEL: Acetaminophen (Tylenol), Serum: <10 ug/mL — ABNORMAL LOW (ref 10–30)

## 2023-06-21 MED ORDER — MIRTAZAPINE 7.5 MG PO TABS
15.0000 mg | ORAL_TABLET | Freq: Every day | ORAL | Status: DC
  Filled 2023-06-21: qty 2

## 2023-06-21 MED ORDER — HALOPERIDOL 5 MG PO TABS
5.0000 mg | ORAL_TABLET | Freq: Every day | ORAL | Status: DC
  Filled 2023-06-21: qty 1
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
## 2023-06-21 NOTE — BH Assessment (Signed)
 TTS requested tele-psychiatry consult with [facility]. Created secure conversation including EDP, Pt's RN, and [facility] Coordinators to facilitate consult. [facility] Coordinator will message with name of provider and scheduled consult time.    Pamalee Leyden, [facility], [facility] Triage Specialist
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
## 2023-06-21 NOTE — ED Notes (Signed)
Pts belongings are in the cabinet behind the nurses' station labeled hall C. Pt has one bag that is labeled with his patient sticker.

## 2023-06-22 DIAGNOSIS — F250 Schizoaffective disorder, bipolar type: Secondary | ICD-10-CM | POA: Diagnosis not present

## 2023-06-22 MED ORDER — OLANZAPINE 10 MG PO TBDP
10.0000 mg | ORAL_TABLET | Freq: Three times a day (TID) | ORAL | Status: DC | PRN
  Administered 2023-06-22 (×2): 10 mg via ORAL
  Filled 2023-06-22 (×2): qty 1

## 2023-06-22 MED ORDER — LORAZEPAM 1 MG PO TABS
1.0000 mg | ORAL_TABLET | Freq: Three times a day (TID) | ORAL | Status: DC | PRN
  Administered 2023-06-22 (×2): 1 mg via ORAL
  Filled 2023-06-22 (×2): qty 1

## 2023-06-22 MED ORDER — OLANZAPINE 5 MG PO TBDP
5.0000 mg | ORAL_TABLET | Freq: Once | ORAL | Status: AC
  Administered 2023-06-22: 5 mg via ORAL
  Filled 2023-06-22: qty 1

## 2023-06-22 MED ORDER — LORAZEPAM 1 MG PO TABS
1.0000 mg | ORAL_TABLET | Freq: Once | ORAL | Status: AC
  Administered 2023-06-22: 1 mg via ORAL
  Filled 2023-06-22: qty 1

## 2023-06-22 MED ORDER — PALIPERIDONE ER 6 MG PO TB24
6.0000 mg | ORAL_TABLET | Freq: Every day | ORAL | Status: DC
  Administered 2023-06-22 – 2023-06-23 (×2): 6 mg via ORAL
  Filled 2023-06-22 (×3): qty 1

## 2023-06-22 NOTE — Progress Notes (Signed)
Patient has been denied by [facility] due to no appropriate beds available. Patient meets [facility] inpatient criteria per [facility]. Patient has been faxed out to the following facilities:   [facility]  [address] [place] [place] [address] [phone]  [facility]  [address] [place] [address] [phone]  [facility] [address]  [place] [address] [phone]  [facility]  [address], [place] [address] [phone]  [facility]  [address] [place] [address] [phone]  [facility]  [address] [place] [address] [phone]  [facility]  [address], [place] [address] [phone]  [facility] EFAX  [address] [place], [place] [phone]  [facility]  [address], [place] [address] [phone]  [facility]  [address] [place] [address] [phone]  [facility]  [address] [place] [address] [phone]  [facility]  [address], [place] [address] [phone]  [facility]  [address] [place], [place] [address] [phone]  [facility]  [address] [place] [address] [phone]  [facility]  [address], [place] [address] [phone]  [facility]  [address], [place] [address] [phone]   Damita Dunnings, MSW, LCSW-A  4:57 PM 06/22/2023
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
## 2023-06-23 ENCOUNTER — Inpatient Hospital Stay (HOSPITAL_COMMUNITY)
Admission: AD | Admit: 2023-06-23 | Discharge: 2023-06-27 | DRG: 885 | Disposition: A | Discharge status: Home / Self Care | Source: Intra-hospital | Attending: Psychiatry | Admitting: Psychiatry

## 2023-06-23 DIAGNOSIS — G4700 Insomnia, unspecified: Secondary | ICD-10-CM | POA: Diagnosis present

## 2023-06-23 DIAGNOSIS — F250 Schizoaffective disorder, bipolar type: Secondary | ICD-10-CM | POA: Diagnosis present

## 2023-06-23 DIAGNOSIS — Z833 Family history of diabetes mellitus: Secondary | ICD-10-CM | POA: Diagnosis not present

## 2023-06-23 DIAGNOSIS — J45909 Unspecified asthma, uncomplicated: Secondary | ICD-10-CM | POA: Diagnosis present

## 2023-06-23 DIAGNOSIS — Z79899 Other long term (current) drug therapy: Secondary | ICD-10-CM | POA: Diagnosis not present

## 2023-06-23 DIAGNOSIS — F17210 Nicotine dependence, cigarettes, uncomplicated: Secondary | ICD-10-CM | POA: Diagnosis present

## 2023-06-23 DIAGNOSIS — F99 Mental disorder, not otherwise specified: Secondary | ICD-10-CM | POA: Diagnosis present

## 2023-06-23 MED ORDER — ALUM & MAG HYDROXIDE-SIMETH 200-200-20 MG/5ML PO SUSP: 30.0000 mL | ORAL | Status: DC | PRN

## 2023-06-23 MED ORDER — OLANZAPINE 10 MG PO TBDP: 10.0000 mg | ORAL_TABLET | Freq: Three times a day (TID) | ORAL | Status: DC | PRN

## 2023-06-23 MED ORDER — HALOPERIDOL 5 MG PO TABS: 5.0000 mg | ORAL_TABLET | Freq: Three times a day (TID) | ORAL | Status: DC | PRN

## 2023-06-23 MED ORDER — DIPHENHYDRAMINE HCL 25 MG PO CAPS: 50.0000 mg | ORAL_CAPSULE | Freq: Three times a day (TID) | ORAL | Status: DC | PRN

## 2023-06-23 MED ORDER — LORAZEPAM 2 MG/ML IJ SOLN: 2.0000 mg | Freq: Three times a day (TID) | INTRAMUSCULAR | Status: DC | PRN

## 2023-06-23 MED ORDER — DIPHENHYDRAMINE HCL 50 MG/ML IJ SOLN: 50.0000 mg | Freq: Three times a day (TID) | INTRAMUSCULAR | Status: DC | PRN

## 2023-06-23 MED ORDER — ACETAMINOPHEN 325 MG PO TABS: 650.0000 mg | ORAL_TABLET | Freq: Four times a day (QID) | ORAL | Status: DC | PRN

## 2023-06-23 MED ORDER — HALOPERIDOL LACTATE 5 MG/ML IJ SOLN: 5.0000 mg | Freq: Three times a day (TID) | INTRAMUSCULAR | Status: DC | PRN

## 2023-06-23 MED ORDER — LORAZEPAM 1 MG PO TABS: 1.0000 mg | ORAL_TABLET | Freq: Three times a day (TID) | ORAL | Status: DC | PRN

## 2023-06-23 MED ORDER — PALIPERIDONE ER 6 MG PO TB24
6.0000 mg | ORAL_TABLET | Freq: Every day | ORAL | Status: DC
  Administered 2023-06-24: 6 mg via ORAL
  Filled 2023-06-23 (×2): qty 1

## 2023-06-23 MED ORDER — LORAZEPAM 1 MG PO TABS: 2.0000 mg | ORAL_TABLET | Freq: Three times a day (TID) | ORAL | Status: DC | PRN

## 2023-06-23 NOTE — ED Notes (Signed)
Pt has been accepted to [facility] on 06/23/2023 Bed  assignment: 501-1  Pt meets inpatient criteria per: Dahlia Byes NP  Attending Physician will be: Phineas Inches, MD   Report can be called to: Adult unit: [phone]  Pt can arrive anytime pending paper work  Care Team Notified: [facility] UJ:[id number] Victory Dakin RN, Dahlia Byes NP, [facility] Paramedic, Roddie Mc RN,

## 2023-06-23 NOTE — ED Notes (Signed)
Pt has been asleep the entire shift. Pt compliant with medications as ordered. Pt has had no behavior issues for this shift. Pt accepted to [facility] today once bed becomes available.

## 2023-06-23 NOTE — ED Provider Notes (Signed)
Emergency Medicine Observation Re-evaluation Note  Ian Key is a 32 y.o. male, seen on rounds today.  Pt initially presented to the ED for complaints of Hallucinations (IVC) Currently, the patient is sleeping.  Physical Exam  BP (!) 81/53   Pulse (!) 55   Temp (!) 97.3 F (36.3 C) (Oral)   Resp 18   Ht 5\' 10"  (1.778 m)   Wt 63.5 kg   SpO2 98%   BMI 20.09 kg/m  Physical Exam General: sleeping Cardiac: well-perfused Lungs: no resp distress Psych: calm, sleeping  ED Course / MDM  EKG:EKG Interpretation Date/Time:  Saturday June 21 2023 21:38:11 EST Ventricular Rate:  83 PR Interval:  145 QRS Duration:  89 QT Interval:  346 QTC Calculation: 397 R Axis:   87  Text Interpretation: Sinus rhythm No acute changes Confirmed by Gilda Crease [phone]) on 06/22/2023 11:43:48 AM  I have reviewed the labs performed to date as well as medications administered while in observation.  Recent changes in the last 24 hours include: psychiatry recommended holding mirtazapine, adding palperidone every day and ativan/olanzapine PRN.  Plan  Current plan is for inpatient hospitalization.    Loetta Rough, MD 06/23/23 [phone]

## 2023-06-23 NOTE — ED Notes (Signed)
Report called to [facility]   [facility] called for transport

## 2023-06-23 NOTE — ED Notes (Signed)
Have called twice to give report   name and number was left for their nurse to call back

## 2023-06-23 NOTE — Progress Notes (Signed)
Pt has been accepted to [facility] on 06/23/2023 Bed  assignment: 501-1  Pt meets inpatient criteria per: Dahlia Byes NP  Attending Physician will be: Phineas Inches, MD   Report can be called to: Adult unit: [phone]  Pt can arrive anytime pending paper work  Care Team Notified: [facility] NU:[id number] Victory Dakin RN, Dahlia Byes NP, [facility] Paramedic, Roddie Mc RN,   [place] Carlisha Wisler LCSW-A   06/23/2023 2:47 PM

## 2023-06-23 NOTE — ED Notes (Signed)
Lunch tray given to patient

## 2023-06-23 NOTE — ED Notes (Signed)
Fax sent to [facility] Night time [facility] Edythe Clarity, RN as requested via [employer], of pt complete IVC paperwork. Informed that pt to be accepted to the [facility] Adult unit once a bed becomes available, possibly later today.

## 2023-06-23 NOTE — Progress Notes (Signed)
LCSW Progress Note  [id number]   KUZEY BERHANE  06/23/2023  11:16 AM  Description:   Inpatient Psychiatric Referral  Patient was recommended inpatient per: Wandra Feinstein MD   There are no available beds at [facility], per [facility] Rona Ravens RN Patient was referred to the following out of network facilities:    Destination  Service Provider Address Phone [facility]  [address] [place] [place] [address] [phone]  [facility]  [address] [place] [address] [phone]  [facility] [address]  [place] [address] [phone]  [facility]  [address] [place] [address] [phone]  [facility]  [address] [place] [address] [phone]  [facility]  [address] [place] [address] [phone]  [facility]  [address], [place] [address] [phone]  [facility] EFAX  [address] [place], [place] [phone]  [facility]  [address] [place] [address] [phone]  [facility]  [address] [place] [address] [phone]  [facility]  [address] [place] [address] [phone]  [facility]  [address], [place] [address] [phone]  [facility]  [address] [place], [place] [address] [phone]  [facility]  [address] [place] [address] [phone]  [facility]  [address] [place] [address] [phone]  [facility]  [address] [place] [address] [phone]  [phone]      Situation ongoing, CSW to continue following and update chart as more information becomes available.      [place] Anisa Leanos LCSW-A   06/23/2023 11:19 AM   06/23/2023 11:16 AM

## 2023-06-23 NOTE — ED Notes (Signed)
Pt has been accepted to [facility] on 06/24/2023 Bed assignment: 700 unit.  Pt meets inpatient criteria per: Dahlia Byes NP  Attending Physician will be: Sherrian Divers MD  Report can be called to: [phone]  Pt can arrive after 9 AM 06/24/2023  Care Team Notified: [facility] AC: Dahlia Byes NP, Carleene Overlie Paramedic

## 2023-06-23 NOTE — Progress Notes (Signed)
Pt has been accepted to [facility] on 06/24/2023 Bed assignment: 700 unit.  Pt meets inpatient criteria per: Dahlia Byes NP  Attending Physician will be: Sherrian Divers MD  Report can be called to: [phone]  Pt can arrive after 9 AM 06/24/2023  Care Team Notified: [facility] AC: Dahlia Byes NP, Carleene Overlie Paramedic    [place] Kaja Jackowski LCSW-A   06/23/2023 12:42 PM

## 2023-06-23 NOTE — ED Notes (Signed)
Pt was given dinner tray.  

## 2023-06-24 ENCOUNTER — Other Ambulatory Visit: Payer: Self-pay

## 2023-06-24 ENCOUNTER — Encounter (HOSPITAL_COMMUNITY): Payer: Self-pay | Admitting: Nurse Practitioner

## 2023-06-24 DIAGNOSIS — F250 Schizoaffective disorder, bipolar type: Secondary | ICD-10-CM | POA: Diagnosis not present

## 2023-06-24 MED ORDER — HALOPERIDOL 5 MG PO TABS
5.0000 mg | ORAL_TABLET | Freq: Two times a day (BID) | ORAL | Status: DC
  Administered 2023-06-24 – 2023-06-27 (×7): 5 mg via ORAL
  Filled 2023-06-24 (×12): qty 1

## 2023-06-24 MED ORDER — TRAZODONE HCL 50 MG PO TABS: 50.0000 mg | ORAL_TABLET | Freq: Every evening | ORAL | Status: DC | PRN

## 2023-06-24 MED ORDER — TRAZODONE HCL 50 MG PO TABS
50.0000 mg | ORAL_TABLET | Freq: Every evening | ORAL | Status: DC | PRN
Start: 2023-06-24 — End: 2023-06-27
  Administered 2023-06-24 – 2023-06-26 (×3): 50 mg via ORAL
  Filled 2023-06-24 (×10): qty 1

## 2023-06-24 MED ORDER — BENZTROPINE MESYLATE 1 MG PO TABS
1.0000 mg | ORAL_TABLET | Freq: Every day | ORAL | Status: DC
  Administered 2023-06-24 – 2023-06-26 (×3): 1 mg via ORAL
  Filled 2023-06-24 (×5): qty 1
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
## 2023-06-24 NOTE — Plan of Care (Signed)
°  Problem: Education: °Goal: Emotional status will improve °Outcome: Progressing °Goal: Mental status will improve °Outcome: Progressing °Goal: Verbalization of understanding the information provided will improve °Outcome: Progressing °  °
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
## 2023-06-24 NOTE — Progress Notes (Signed)
   06/24/23 2200  Psych Admission Type (Psych Patients Only)  Admission Status Involuntary  Psychosocial Assessment  Patient Complaints None  Eye Contact Fair  Facial Expression Animated  Affect Appropriate to circumstance  Speech Logical/coherent  Interaction Assertive  Motor Activity Slow  Appearance/Hygiene Unremarkable  Behavior Characteristics Cooperative  Mood Pleasant  Thought Process  Coherency WDL  Content WDL  Delusions None reported or observed  Perception WDL  Hallucination None reported or observed  Judgment Poor  Confusion None  Danger to Self  Current suicidal ideation? Denies  Danger to Others  Danger to Others None reported or observed

## 2023-06-24 NOTE — Group Note (Signed)
Date:  06/24/2023 Time:  1:55 PM  Group Topic/Focus:  Dimensions of Wellness:   The focus of this group is to introduce the topic of wellness and discuss the role each dimension of wellness plays in total health.    Participation Level:  Active  Participation Quality:  Appropriate  Affect:  Appropriate  Cognitive:  Appropriate  Insight: Appropriate  Engagement in Group:  Engaged  Modes of Intervention:  Education  Additional Comments:     Reymundo Poll 06/24/2023, 1:55 PM
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
## 2023-06-24 NOTE — Group Note (Signed)
Recreation Therapy Group Note   Group Topic:Health and Wellness  Group Date: 06/24/2023 Start Time: 1045 End Time: 1125 Facilitators: Santosha Jividen-McCall, LRT,CTRS Location: [address] Dayroom   Group Topic: Wellness  Goal Area(s) Addresses:  Patient will define components of whole wellness. Patient will verbalize benefit of whole wellness.  Group Description: Patients and LRT discussed the importance of physical health and how it co-insides with mental and spiritual health. Patients and LRT participated in a series of line dances to increase heart rate and get the muscles moving/stretched out.   Education: Wellness, Building control surveyor.   Education Outcome: Acknowledges education/In group clarification offered/Needs additional education.    Affect/Mood: N/A   Participation Level: Did not attend    Clinical Observations/Individualized Feedback:     Plan: Continue to engage patient in RT group sessions 2-3x/week.   Mckenzey Parcell-McCall, LRT,CTRS 06/24/2023 12:37 PM

## 2023-06-24 NOTE — Progress Notes (Signed)
   06/24/23 0805  Psych Admission Type (Psych Patients Only)  Admission Status Involuntary  Psychosocial Assessment  Patient Complaints None  Eye Contact Fair  Facial Expression Anxious  Affect Euphoric  Speech Logical/coherent  Interaction Assertive  Motor Activity Other (Comment) (WDL)  Appearance/Hygiene Unremarkable  Behavior Characteristics Cooperative  Mood Pleasant  Thought Process  Coherency WDL  Content WDL  Delusions None reported or observed  Perception WDL  Hallucination None reported or observed  Judgment Limited  Confusion None  Danger to Self  Current suicidal ideation? Denies  Danger to Others  Danger to Others None reported or observed

## 2023-06-24 NOTE — Progress Notes (Signed)
Ian Key did not attend wrap up group.

## 2023-06-24 NOTE — Progress Notes (Signed)
Admission Note: report received from [facility] RN RN patient is a .. IVC year old 69 male from [facility] ED Pt was calm and cooperative during assessment. Denies SI/HI/AVH. Admission plan of care reviewed with pt, consent signed.  Personal belongings/skin assessment completed.   No contraband found.  Patient oriented to the unit, staff and room.  Routine safety checks initiated.  Verbalizes understanding of unit rules/protocols.   Patient is presently safe on the unit. No unsafe behaviors noted.  Q 15 minute safety checks maintained per unit protocol.

## 2023-06-25 ENCOUNTER — Encounter (HOSPITAL_COMMUNITY): Payer: Self-pay

## 2023-06-25 DIAGNOSIS — F250 Schizoaffective disorder, bipolar type: Secondary | ICD-10-CM | POA: Diagnosis not present

## 2023-06-25 LAB — COMPREHENSIVE METABOLIC PANEL
ALT: 25 U/L (ref 0–44)
AST: 25 U/L (ref 15–41)
Albumin: 4.4 g/dL (ref 3.5–5.0)
Alkaline Phosphatase: 38 U/L (ref 38–126)
Anion gap: 9 (ref 5–15)
BUN: 9 mg/dL (ref 6–20)
CO2: 23 mmol/L (ref 22–32)
Calcium: 9.1 mg/dL (ref 8.9–10.3)
Chloride: 105 mmol/L (ref 98–111)
Creatinine, Ser: 1.09 mg/dL (ref 0.61–1.24)
GFR, Estimated: >60 mL/min (ref >60)
Glucose, Bld: 105 mg/dL — ABNORMAL HIGH (ref 70–99)
Potassium: 4 mmol/L (ref 3.5–5.1)
Sodium: 137 mmol/L (ref 135–145)
Total Bilirubin: 0.6 mg/dL (ref <1.2)
Total Protein: 7 g/dL (ref 6.5–8.1)

## 2023-06-25 LAB — TSH: TSH: 1.687 u[IU]/mL (ref 0.350–4.500)

## 2023-06-25 LAB — HEMOGLOBIN A1C
Hgb A1c MFr Bld: 5.5 % (ref 4.8–5.6)
Mean Plasma Glucose: 111.15 mg/dL

## 2023-06-25 LAB — LIPID PANEL
Cholesterol: 144 mg/dL (ref 0–200)
HDL: 45 mg/dL (ref >40)
LDL Cholesterol: 80 mg/dL (ref 0–99)
Total CHOL/HDL Ratio: 3.2 {ratio}
Triglycerides: 96 mg/dL (ref <150)
VLDL: 19 mg/dL (ref 0–40)
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
## 2023-06-25 NOTE — Progress Notes (Signed)
   06/25/23 0800  Psych Admission Type (Psych Patients Only)  Admission Status Involuntary  Psychosocial Assessment  Patient Complaints None  Eye Contact Fair  Facial Expression Animated  Affect Appropriate to circumstance  Speech Logical/coherent  Interaction Assertive  Motor Activity Slow  Appearance/Hygiene Unremarkable  Behavior Characteristics Cooperative  Mood Pleasant  Thought Process  Coherency WDL  Content WDL  Delusions None reported or observed  Perception WDL  Hallucination None reported or observed  Judgment Poor  Confusion None  Danger to Self  Current suicidal ideation? Denies  Danger to Others  Danger to Others None reported or observed

## 2023-06-25 NOTE — Progress Notes (Signed)
Ian Key did not attend wrap up group.

## 2023-06-25 NOTE — Plan of Care (Signed)
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
## 2023-06-25 NOTE — BHH Group Notes (Signed)
Adult Psychoeducational Group Note  Date:  06/25/2023 Time:  9:32 AM  Group Topic/Focus:  Goals Group:   The focus of this group is to help patients establish daily goals to achieve during treatment and discuss how the patient can incorporate goal setting into their daily lives to aide in recovery.  Participation Level:  Active  Participation Quality:  Appropriate  Affect:  Appropriate  Cognitive:  Appropriate  Insight: Appropriate  Engagement in Group:  Engaged  Modes of Intervention:  Orientation  Additional Comments:  Pt goal for today is to work on developing new health coping skills  Dellia Nims 06/25/2023, 9:32 AM

## 2023-06-25 NOTE — BH IP Treatment Plan (Signed)
Interdisciplinary Treatment and Diagnostic Plan Update  06/25/2023 Time of Session: 10:50AM Ian Key MRN: [id number]  Principal Diagnosis: Schizoaffective disorder, bipolar type (HCC)  Secondary Diagnoses: Principal Problem:   Schizoaffective disorder, bipolar type (HCC)   Current Medications:  Current Facility-Administered Medications  Medication Dose Route Frequency Provider Last Rate Last Admin   acetaminophen (TYLENOL) tablet 650 mg  650 mg Oral Q6H PRN Dahlia Byes C, NP       alum & mag hydroxide-simeth (MAALOX/MYLANTA) 200-200-20 MG/5ML suspension 30 mL  30 mL Oral Q4H PRN Dahlia Byes C, NP       benztropine (COGENTIN) tablet 1 mg  1 mg Oral QHS Carrion-Carrero, Margely, MD   1 mg at 06/24/23 2036   diphenhydrAMINE (BENADRYL) capsule 50 mg  50 mg Oral TID PRN Earney Navy, NP       Or   diphenhydrAMINE (BENADRYL) injection 50 mg  50 mg Intramuscular TID PRN Dahlia Byes C, NP       haloperidol (HALDOL) tablet 5 mg  5 mg Oral TID PRN Dahlia Byes C, NP       Or   haloperidol lactate (HALDOL) injection 5 mg  5 mg Intramuscular TID PRN Dahlia Byes C, NP       haloperidol (HALDOL) tablet 5 mg  5 mg Oral Q12H Carrion-Carrero, Margely, MD   5 mg at 06/25/23 0833   LORazepam (ATIVAN) tablet 2 mg  2 mg Oral TID PRN Dahlia Byes C, NP       Or   LORazepam (ATIVAN) injection 2 mg  2 mg Intramuscular TID PRN Dahlia Byes C, NP       LORazepam (ATIVAN) tablet 1 mg  1 mg Oral TID PRN Dahlia Byes C, NP       traZODone (DESYREL) tablet 50 mg  50 mg Oral QHS,MR X 1 Carrion-Carrero, Margely, MD   50 mg at 06/24/23 2036   PTA Medications: Medications Prior to Admission  Medication Sig Dispense Refill Last Dose   haloperidol (HALDOL) 5 MG tablet Take 1 tablet (5 mg total) by mouth at bedtime. (Patient not taking: Reported on 06/22/2023) 14 tablet 0    mirtazapine (REMERON) 15 MG tablet Take 1 tablet (15 mg total) by mouth at bedtime.  (Patient not taking: Reported on 06/22/2023) 14 tablet 0     Patient Stressors:    Patient Strengths:    Treatment Modalities: Medication Management, Group therapy, Case management,  1 to 1 session with clinician, Psychoeducation, Recreational therapy.   Physician Treatment Plan for Primary Diagnosis: Schizoaffective disorder, bipolar type (HCC) Long Term Goal(s): Improvement in symptoms so as ready for discharge   Short Term Goals: Ability to identify changes in lifestyle to reduce recurrence of condition will improve Ability to verbalize feelings will improve  Medication Management: Evaluate patient's response, side effects, and tolerance of medication regimen.  Therapeutic Interventions: 1 to 1 sessions, Unit Group sessions and Medication administration.  Evaluation of Outcomes: Not Progressing  Physician Treatment Plan for Secondary Diagnosis: Principal Problem:   Schizoaffective disorder, bipolar type (HCC)  Long Term Goal(s): Improvement in symptoms so as ready for discharge   Short Term Goals: Ability to identify changes in lifestyle to reduce recurrence of condition will improve Ability to verbalize feelings will improve     Medication Management: Evaluate patient's response, side effects, and tolerance of medication regimen.  Therapeutic Interventions: 1 to 1 sessions, Unit Group sessions and Medication administration.  Evaluation of Outcomes: Not Progressing   RN Treatment Plan  for Primary Diagnosis: Schizoaffective disorder, bipolar type (HCC) Long Term Goal(s): Knowledge of disease and therapeutic regimen to maintain health will improve  Short Term Goals: Ability to remain free from injury will improve, Ability to verbalize frustration and anger appropriately will improve, Ability to demonstrate self-control, Ability to participate in decision making will improve, Ability to verbalize feelings will improve, Ability to disclose and discuss suicidal ideas, Ability  to identify and develop effective coping behaviors will improve, and Compliance with prescribed medications will improve  Medication Management: RN will administer medications as ordered by provider, will assess and evaluate patient's response and provide education to patient for prescribed medication. RN will report any adverse and/or side effects to prescribing provider.  Therapeutic Interventions: 1 on 1 counseling sessions, Psychoeducation, Medication administration, Evaluate responses to treatment, Monitor vital signs and CBGs as ordered, Perform/monitor CIWA, COWS, AIMS and Fall Risk screenings as ordered, Perform wound care treatments as ordered.  Evaluation of Outcomes: Not Progressing   LCSW Treatment Plan for Primary Diagnosis: Schizoaffective disorder, bipolar type (HCC) Long Term Goal(s): Safe transition to appropriate next level of care at discharge, Engage patient in therapeutic group addressing interpersonal concerns.  Short Term Goals: Engage patient in aftercare planning with referrals and resources, Increase social support, Increase ability to appropriately verbalize feelings, Increase emotional regulation, Facilitate acceptance of mental health diagnosis and concerns, Facilitate patient progression through stages of change regarding substance use diagnoses and concerns, Identify triggers associated with mental health/substance abuse issues, and Increase skills for wellness and recovery  Therapeutic Interventions: Assess for all discharge needs, 1 to 1 time with Social worker, Explore available resources and support systems, Assess for adequacy in community support network, Educate family and significant other(s) on suicide prevention, Complete Psychosocial Assessment, Interpersonal group therapy.  Evaluation of Outcomes: Not Progressing   Progress in Treatment: Attending groups: Yes. Participating in groups: Yes. Taking medication as prescribed: Yes. Toleration medication:  Yes. Family/Significant other contact made: No, will contact:  consent pending Patient understands diagnosis: No. Discussing patient identified problems/goals with staff: Yes. Medical problems stabilized or resolved: Yes. Denies suicidal/homicidal ideation: Yes. Issues/concerns per patient self-inventory: No.   New problem(s) identified: No, Describe:  none reported  New Short Term/Long Term Goal(s): medication stabilization, elimination of SI thoughts, development of comprehensive mental wellness plan.    Patient Goals:  "Avoid conflict, exercise, get more coping skills"  Discharge Plan or Barriers: Patient recently admitted. CSW will continue to follow and assess for appropriate referrals and possible discharge planning.    Reason for Continuation of Hospitalization: Medication stabilization Other; describe schizoaffective disorder, bipolar type  Estimated Length of Stay: 5-7 days  Last 3 [place] Suicide Severity Risk Score: Flowsheet Row [facility] (Current[facility] from [facility] in [facility] ED from [facility]/16/2022 in [facility]  C-SSRS RISK CATEGORY No Risk No Risk No Risk       Last [facility] 2/9 Scores:     No data to display          Scribe for Treatment Team: Kathi Der, Theresia Majors 06/25/2023 12:48 PM

## 2023-06-25 NOTE — Group Note (Signed)
Recreation Therapy Group Note   Group Topic:Problem Solving  Group Date: 06/25/2023 Start Time: 1013 End Time: 1035 Facilitators: Evolet Salminen-McCall, LRT,CTRS Location: [address] Day[address]   Goal Area(s) Addresses:  Patient will effectively work with peer towards shared goal.  Patient will identify skills used to make activity successful.  Patient will identify how skills used during activity can be used to reach post d/c goals.   Intervention: STEM Activity  Group Description: Straw Bridge. In teams of 3-5, patients were given 15 plastic drinking straws and an equal length of masking tape. Using the materials provided, patients were instructed to build a free standing bridge-like structure to suspend an everyday item (ex: puzzle box) off of the floor or table surface. All materials were required to be used by the team in their design. LRT facilitated post-activity discussion reviewing team process. Patients were encouraged to reflect how the skills used in this activity can be generalized to daily life post discharge.   Education: Pharmacist, community, Scientist, physiological, Discharge Planning   Education Outcome: Acknowledges education/In group clarification offered/Needs additional education.    Affect/Mood: Appropriate   Participation Level: Engaged   Participation Quality: Independent   Behavior: Appropriate   Speech/Thought Process: Focused   Insight: Good   Judgement: Good   Modes of Intervention: STEM Activity   Patient Response to Interventions:  Engaged   Education Outcome:  In group clarification offered    Clinical Observations/Individualized Feedback: Pt was bright and engaged throughout activity. Pt would step in when peers weren't able to complete certain parts of the structure. Pt was attentive and focused throughout activity.      Plan: Continue to engage patient in RT group sessions 2-3x/week.   Rumor Sun-McCall, LRT,CTRS 06/25/2023 11:49 AM

## 2023-06-25 NOTE — Plan of Care (Signed)

## 2023-06-26 DIAGNOSIS — F250 Schizoaffective disorder, bipolar type: Secondary | ICD-10-CM | POA: Diagnosis not present

## 2023-06-26 MED ORDER — HALOPERIDOL DECANOATE 100 MG/ML IM SOLN
100.0000 mg | Freq: Once | INTRAMUSCULAR | Status: AC
  Administered 2023-06-26: 100 mg via INTRAMUSCULAR
  Filled 2023-06-26: qty 1

## 2023-06-26 NOTE — Group Note (Signed)
Date:  06/26/2023 Time:  5:12 PM  Group Topic/Focus:   Emotional Education:   The focus of this group is to discuss what boundaries are, and how they are experienced.    Participation Level:  Minimal  Participation Quality:  Attentive  Affect:  Appropriate  Cognitive:  Appropriate  Insight: None  Engagement in Group:  Lacking  Modes of Intervention:  Discussion and Education  Additional Comments:    Arnoldo Hooker 06/26/2023, 5:12 PM

## 2023-06-26 NOTE — [facility] Group Notes (Signed)
Adult Psychoeducational Group Note  Date:  06/26/2023 Time:  9:49 AM  Group Topic/Focus:  Goals Group:   The focus of this group is to help patients establish daily goals to achieve during treatment and discuss how the patient can incorporate goal setting into their daily lives to aide in recovery.  Orientation:   The focus of this group is to educate the patient on the purpose and policies of crisis stabilization and provide a format to answer questions about their admission.  The group details unit policies and expectations of patients while admitted.  Participation Level:  Active  Participation Quality:  Attentive  Affect:  Appropriate  Cognitive:  Appropriate  Insight: Appropriate  Engagement in Group:  Engaged  Modes of Intervention:  Orientation  Additional Comments:    Pt attended and participated in orientation/goals group. Pt answered all the questions below:  Where you're from: [place] Goal for today: " I plan to work on staying healthy and side effect free" Something interested about you that people may or may not know: " I'm currently working on comic books" Where do you see yourself in 5 years: " I want to go back to school"   Dellia Nims 06/26/2023, 9:49 AM

## 2023-06-26 NOTE — Group Note (Signed)
Date:  06/26/2023 Time:  9:29 PM  Group Topic/Focus:  Wrap-Up Group:   The focus of this group is to help patients review their daily goal of treatment and discuss progress on daily workbooks.    Participation Level:  Active  Participation Quality:  Appropriate and Attentive  Affect:  Appropriate  Cognitive:  Alert and Appropriate  Insight: Appropriate and Good  Engagement in Group:  Engaged  Modes of Intervention:  Discussion and Education  Additional Comments:  Pt attended and participated in wrap up group this evening and rated their day 10/10. Pt stated that they worked on setting boundaries and staying healthy with no side effects from their medication. Pt feels like they are ready for discharge.   Chrisandra Netters 06/26/2023, 9:29 PM

## 2023-06-26 NOTE — Progress Notes (Signed)
   06/26/23 2015  Psych Admission Type (Psych Patients Only)  Admission Status Involuntary  Psychosocial Assessment  Patient Complaints None  Eye Contact Fair  Facial Expression Animated  Affect Appropriate to circumstance  Speech Logical/coherent  Interaction Assertive  Motor Activity Slow  Appearance/Hygiene Unremarkable  Behavior Characteristics Cooperative  Mood Pleasant  Thought Process  Coherency WDL  Content WDL  Delusions None reported or observed  Perception WDL  Hallucination None reported or observed  Judgment Poor  Confusion None  Danger to Self  Current suicidal ideation? Denies  Danger to Others  Danger to Others None reported or observed

## 2023-06-26 NOTE — [facility] Group Notes (Signed)
Pt attended nutrition group. 

## 2023-06-26 NOTE — Plan of Care (Signed)
  Problem: Education: Goal: Emotional status will improve Outcome: Progressing Goal: Mental status will improve Outcome: Progressing Goal: Verbalization of understanding the information provided will improve Outcome: Progressing   Problem: Activity: Goal: Interest or engagement in activities will improve Outcome: Progressing Goal: Sleeping patterns will improve Outcome: Progressing   Problem: Coping: Goal: Ability to verbalize frustrations and anger appropriately will improve Outcome: Progressing Goal: Ability to demonstrate self-control will improve Outcome: Progressing   Problem: Health Behavior/Discharge Planning: Goal: Identification of resources available to assist in meeting health care needs will improve Outcome: Progressing Goal: Compliance with treatment plan for underlying cause of condition will improve Outcome: Progressing   Problem: Physical Regulation: Goal: Ability to maintain clinical measurements within normal limits will improve Outcome: Progressing   Problem: Safety: Goal: Periods of time without injury will increase Outcome: Progressing

## 2023-06-26 NOTE — Group Note (Signed)
Recreation Therapy Group Note   Group Topic:Communication  Group Date: 06/26/2023 Start Time: 1000 End Time: 1025 Facilitators: Hadassah Rana-McCall, LRT,CTRS Location: [address] Dayroom   Group Topic: Communication, Problem Solving   Goal Area(s) Addresses:  Patient will effectively listen to complete activity.  Patient will identify communication skills used to make activity successful.  Patient will identify how skills used during activity can be used to reach post d/c goals.    Intervention: Building surveyor Activity - Geometric pattern cards, pencils, blank paper    Group Description: Geometric Drawings.  Three volunteers from the peer group will be shown an abstract picture with a particular arrangement of geometrical shapes.  Each round, one 'speaker' will describe the pattern, as accurately as possible without revealing the image to the group.  The remaining group members will listen and draw the picture to reflect how it is described to them. Patients with the role of 'listener' cannot ask clarifying questions but, may request that the speaker repeat a direction. Once the drawings are complete, the presenter will show the rest of the group the picture and compare how close each person came to drawing the picture. LRT will facilitate a post-activity discussion regarding effective communication and the importance of planning, listening, and asking for clarification in daily interactions with others.  Education: Environmental consultant, Active listening, Support systems, Discharge planning  Education Outcome: Acknowledges understanding/In group clarification offered/Needs additional education.    Affect/Mood: Appropriate   Participation Level: Engaged   Participation Quality: Independent   Behavior: Appropriate   Speech/Thought Process: Focused   Insight: Good   Judgement: Good   Modes of Intervention: Art   Patient Response to Interventions:  Engaged    Education Outcome:  In group clarification offered    Clinical Observations/Individualized Feedback: Pt was quiet but engaged when prompted. Pt identified body language as a form of communication. Pt took his time while presenting and gave peers time to draw what was described. Pt felt he didn't do good, however, his peer stated he was more descriptive in his presentation.     Plan: Continue to engage patient in RT group sessions 2-3x/week.   Ian Key, LRT,CTRS 06/26/2023 11:24 AM

## 2023-06-26 NOTE — Group Note (Signed)
LCSW Group Therapy Note  Group Date: 06/26/2023 Start Time: 1100 End Time: 1200   Type of Therapy and Topic:  Group Therapy - Healthy vs Unhealthy Coping Skills  Participation Level:  Did Not Attend   Description of Group The focus of this group was to determine what unhealthy coping techniques typically are used by group members and what healthy coping techniques would be helpful in coping with various problems. Patients were guided in becoming aware of the differences between healthy and unhealthy coping techniques. Patients were asked to identify 2-3 healthy coping skills they would like to learn to use more effectively.  Therapeutic Goals Patients learned that coping is what human beings do all day long to deal with various situations in their lives Patients defined and discussed healthy vs unhealthy coping techniques Patients identified their preferred coping techniques and identified whether these were healthy or unhealthy Patients determined 2-3 healthy coping skills they would like to become more familiar with and use more often. Patients provided support and ideas to each other   Summary of Patient Progress:  Did not attend   Therapeutic Modalities Cognitive Behavioral Therapy Motivational Interviewing  Marinda Elk, [place] 06/26/2023  1:57 PM
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
## 2023-06-26 NOTE — Progress Notes (Signed)
   06/26/23 0034  Psych Admission Type (Psych Patients Only)  Admission Status Involuntary  Psychosocial Assessment  Patient Complaints None  Eye Contact Fair  Facial Expression Animated  Affect Appropriate to circumstance  Speech Logical/coherent  Interaction Assertive  Motor Activity Slow  Appearance/Hygiene Unremarkable  Behavior Characteristics Cooperative  Mood Pleasant  Thought Process  Coherency WDL  Content WDL  Delusions None reported or observed  Perception WDL  Hallucination None reported or observed  Judgment Poor  Confusion None  Danger to Self  Current suicidal ideation? Denies  Danger to Others  Danger to Others None reported or observed

## 2023-06-27 MED ORDER — BENZTROPINE MESYLATE 1 MG PO TABS: 1.0000 mg | ORAL_TABLET | Freq: Every day | ORAL | 0 refills | Status: AC

## 2023-06-27 MED ORDER — HALOPERIDOL DECANOATE 100 MG/ML IM SOLN: 100.0000 mg | INTRAMUSCULAR | 0 refills | Status: AC

## 2023-06-27 MED ORDER — HALOPERIDOL DECANOATE 100 MG/ML IM SOLN: 100.0000 mg | INTRAMUSCULAR | Status: DC

## 2023-06-27 MED ORDER — HALOPERIDOL 5 MG PO TABS: 5.0000 mg | ORAL_TABLET | Freq: Two times a day (BID) | ORAL | 0 refills | Status: AC

## 2023-06-27 MED ORDER — TRAZODONE HCL 50 MG PO TABS: 50.0000 mg | ORAL_TABLET | Freq: Every evening | ORAL | 0 refills | Status: AC | PRN

## 2023-06-27 NOTE — Plan of Care (Signed)

## 2023-06-27 NOTE — Progress Notes (Signed)
Pt discharged at this time. Pt removed all belongings and verbalized understanding of medications and discharge instructions. Pt denies SI/HI as well as AVH.
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
## 2023-06-27 NOTE — Plan of Care (Signed)
  Problem: Education: Goal: Knowledge of [facility] Education information/materials will improve Outcome: Progressing   Problem: Education: Goal: Emotional status will improve Outcome: Progressing   Problem: Education: Goal: Verbalization of understanding the information provided will improve Outcome: Progressing   Problem: Coping: Goal: Ability to demonstrate self-control will improve Outcome: Progressing

## 2023-06-27 NOTE — Progress Notes (Signed)
  [facility] Adult Case Management Discharge Plan :  Will you be returning to the same living situation after discharge:  Yes,  Pt will be returning back home with his mom  At discharge, do you have transportation home?: Yes,  Pt mom will be here at 11:30 AM  Do you have the ability to pay for your medications: Yes,  [facility] [place] [facility] [place]   Release of information consent forms completed and in the chart;  Patient's signature needed at discharge.  Patient to Follow up at:  Follow-up Information     [facility] Follow up on 07/07/2023.   Why: You have a nurse call on 07/07/23 at 10:00 am, Virtual telehealth.  You also have a hospital follow up appointment on 07/17/23 at 10:30 am, in person. Contact information: [address] [facility] Freada Bergeron [place] [address] [phone]                 Next level of care provider has access to [facility] Link:no  Safety Planning and Suicide Prevention discussed: Yes,   Hanna Comins ( mom ) [phone]     Has patient been referred to the Quitline?: Patient refused referral for treatment  Patient has been referred for addiction treatment: No known substance use disorder. Pt does smoke marijuana   XAIVIER BELDIN, LCSWA 06/27/2023, 11:08 AM

## 2023-06-27 NOTE — Group Note (Signed)
Date:  06/27/2023 Time:  12:25 PM  Group Topic/Focus:  Orientation:   The focus of this group is to educate the patient on the purpose and policies of crisis stabilization and provide a format to answer questions about their admission.  The group details unit policies and expectations of patients while admitted.    Participation Level:  Active  Participation Quality:  Appropriate  Affect:  Appropriate  Cognitive:  Appropriate  Insight: Appropriate  Engagement in Group:  Engaged  Modes of Intervention:  Discussion  Additional Comments:     Reymundo Poll 06/27/2023, 12:25 PM

## 2023-06-27 NOTE — BHH Group Notes (Signed)
Spiritual care group facilitated by Chaplain Katy Denessa Cavan, BCC  Group focused on topic of strength. Group members reflected on what thoughts and feelings emerge when they hear this topic. They then engaged in facilitated dialog around how strength is present in their lives. This dialog focused on representing what strength had been to them in their lives (images and patterns given) and what they saw as helpful in their life now (what they needed / wanted).  Activity drew on narrative framework.  Patient Progress: Did not attend.  
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
## 2023-06-27 NOTE — Progress Notes (Signed)
   06/27/23 7253  15 Minute Checks  Location Bedroom  Visual Appearance Calm  Behavior Sleeping  Sleep (Behavioral Health Patients Only)  Calculate sleep? (Click Yes once per 24 hr at 0600 safety check) Yes  Documented sleep last 24 hours 9.25

## 2023-06-27 NOTE — Progress Notes (Signed)
BHH/BMU LCSW Progress Note   06/27/2023    11:14 AM  Ian Key      Type of Note: [employer] IM Notice Given   Patient informed of right to appeal discharge, provided phone number to [facility]. Patient expressed no interest in appealing discharge at this time. CSW will continue to monitor situation.     Signed:   Jacob Moores, MSW, [facility] 06/27/2023 11:14 AM

## 2023-06-27 NOTE — BHH Suicide Risk Assessment (Signed)
BHH INPATIENT:  Family/Significant Other Suicide Prevention Education  Suicide Prevention Education:  Education Completed; Lemont Duel ( mom ) [phone],  (name of family member/significant other) has been identified by the patient as the family member/significant other with whom the patient will be residing, and identified as the person(s) who will aid the patient in the event of a mental health crisis (suicidal ideations/suicide attempt).  With written consent from the patient, the family member/significant other has been provided the following suicide prevention education, prior to the and/or following the discharge of the patient.  The suicide prevention education provided includes the following: Suicide risk factors Suicide prevention and interventions National Suicide Hotline telephone number [facility] assessment telephone number [facility] Emergency Assistance 911 [facility] and/or Residential Mobile Crisis Unit telephone number  Request made of family/significant other to: Remove weapons (e.g., guns, rifles, knives), all items previously/currently identified as safety concern.   Remove drugs/medications (over-the-counter, prescriptions, illicit drugs), all items previously/currently identified as a safety concern.  The family member/significant other verbalizes understanding of the suicide prevention education information provided.  The family member/significant other agrees to remove the items of safety concern listed above.  ANDREAZ KALMBACH 06/27/2023, 10:20 AM
